# Patient Record
Sex: Female | Born: 1945 | Race: White | Hispanic: No | Marital: Married | Smoking: Never smoker
Health system: Southern US, Community
[De-identification: ages and names within clinical notes are randomized; demographics above are authoritative.]

## PROBLEM LIST (undated history)

## (undated) DIAGNOSIS — I340 Nonrheumatic mitral (valve) insufficiency: Secondary | ICD-10-CM

## (undated) DIAGNOSIS — E039 Hypothyroidism, unspecified: Secondary | ICD-10-CM

## (undated) DIAGNOSIS — Z780 Asymptomatic menopausal state: Secondary | ICD-10-CM

## (undated) DIAGNOSIS — Q210 Ventricular septal defect: Secondary | ICD-10-CM

## (undated) DIAGNOSIS — L309 Dermatitis, unspecified: Secondary | ICD-10-CM

## (undated) DIAGNOSIS — E785 Hyperlipidemia, unspecified: Secondary | ICD-10-CM

## (undated) DIAGNOSIS — I5189 Other ill-defined heart diseases: Secondary | ICD-10-CM

## (undated) HISTORY — DX: Nonrheumatic mitral (valve) insufficiency: I34.0

## (undated) HISTORY — DX: Hypothyroidism, unspecified: E03.9

## (undated) HISTORY — DX: Hyperlipidemia, unspecified: E78.5

## (undated) HISTORY — DX: Ventricular septal defect: Q21.0

## (undated) HISTORY — PX: COMBINED HYSTEROSCOPY DIAGNOSTIC / D&C: SUR297

## (undated) HISTORY — DX: Dermatitis, unspecified: L30.9

## (undated) HISTORY — DX: Asymptomatic menopausal state: Z78.0

## (undated) HISTORY — DX: Other ill-defined heart diseases: I51.89

---

## 1998-12-06 ENCOUNTER — Encounter: Payer: Self-pay | Admitting: Family Medicine

## 1998-12-06 ENCOUNTER — Encounter: Admission: RE | Admit: 1998-12-06 | Discharge: 1998-12-06 | Payer: Self-pay | Admitting: Family Medicine

## 1999-10-24 ENCOUNTER — Other Ambulatory Visit: Admission: RE | Admit: 1999-10-24 | Discharge: 1999-10-24 | Payer: Self-pay | Admitting: *Deleted

## 1999-12-07 ENCOUNTER — Encounter: Payer: Self-pay | Admitting: Family Medicine

## 1999-12-07 ENCOUNTER — Encounter: Admission: RE | Admit: 1999-12-07 | Discharge: 1999-12-07 | Payer: Self-pay | Admitting: Family Medicine

## 2003-10-07 ENCOUNTER — Other Ambulatory Visit: Admission: RE | Admit: 2003-10-07 | Discharge: 2003-10-07 | Payer: Self-pay | Admitting: *Deleted

## 2003-10-13 ENCOUNTER — Encounter: Admission: RE | Admit: 2003-10-13 | Discharge: 2003-10-13 | Payer: Self-pay | Admitting: Family Medicine

## 2003-10-19 ENCOUNTER — Encounter: Admission: RE | Admit: 2003-10-19 | Discharge: 2003-10-19 | Payer: Self-pay | Admitting: Family Medicine

## 2003-12-28 ENCOUNTER — Encounter (INDEPENDENT_AMBULATORY_CARE_PROVIDER_SITE_OTHER): Payer: Self-pay | Admitting: Specialist

## 2003-12-28 ENCOUNTER — Ambulatory Visit (HOSPITAL_COMMUNITY): Admission: RE | Admit: 2003-12-28 | Discharge: 2003-12-28 | Payer: Self-pay | Admitting: Gynecology

## 2003-12-28 ENCOUNTER — Ambulatory Visit (HOSPITAL_BASED_OUTPATIENT_CLINIC_OR_DEPARTMENT_OTHER): Admission: RE | Admit: 2003-12-28 | Discharge: 2003-12-28 | Payer: Self-pay | Admitting: Gynecology

## 2007-07-17 ENCOUNTER — Other Ambulatory Visit: Admission: RE | Admit: 2007-07-17 | Discharge: 2007-07-17 | Payer: Self-pay | Admitting: Family Medicine

## 2008-09-03 ENCOUNTER — Emergency Department (HOSPITAL_COMMUNITY): Admission: EM | Admit: 2008-09-03 | Discharge: 2008-09-03 | Payer: Self-pay | Admitting: Emergency Medicine

## 2010-02-13 ENCOUNTER — Encounter: Payer: Self-pay | Admitting: Family Medicine

## 2010-06-09 NOTE — Op Note (Signed)
Sandra Monroe, Sandra Monroe               ACCOUNT NO.:  [id number]   MEDICAL RECORD NO.:  [id number]          PATIENT TYPE:  AMB   LOCATION:  [facility]                         FACILITY:  [facility]   PHYSICIAN:  Ivor Costa. Farrel Gobble, M.D. DATE OF BIRTH:  1945/09/06   DATE OF PROCEDURE:  12/28/2003  DATE OF DISCHARGE:                                 OPERATIVE REPORT   PREOPERATIVE DIAGNOSES:  1.  Postmenopausal bleeding.  2.  Endometrial defect.   POSTOPERATIVE DIAGNOSES:  1.  Postmenopausal bleeding.  2.  Endometrial defect.   PROCEDURE:  Hysteroscopic myomectomy, dilatation and curettage,  hysteroscopy.   SURGEON:  Ivor Costa. Farrel Gobble, M.D.   ANESTHESIA:  General plus paracervical block with 0.5% Marcaine solution.   ESTIMATED BLOOD LOSS:  Minimal.   I&O DEFICIT:  3%.  Sorbitol was 185, minus a large amount on the floor and  towels.   FINDINGS:  A large posterior wall defect, probable fibroid with fluffy  endometrium.   PATHOLOGY:  Endometrial curettings, endometrial resection, and cervical  curettings.   COMPLICATIONS:  None.   PROCEDURE:  Patient was taken to the operating room.  General anesthesia was  induced.  She was placed in a dorsal lithotomy position.  Laminaria placed  the night before was removed, and then prepped and draped in the usual  sterile fashion.  A bivalve speculum was placed in the vagina, and the  cervix was visualized and stabilized with a single-tooth tenaculum.  Then 20  cc of dilute Pitressin solution was then injected circumferentially around  the cervix, after which uterine sound was placed for orientation of the  canal.  The cervix was dilated up to 31 [place], after which the operative  hysteroscope was advanced through the cervix.  Initially what appeared to be  very fluffy endometrium somewhat obscuring our view was seen; therefore, the  hysteroscope was removed and sharp curettage was performed.  This was sent  as a separate specimen.  The hysteroscope was  then advanced back through the  cervix, and at this point, a large posterior wall defect essentially  obliterating the cavity was appreciated, and this was probably the defect we  had seen on the sonohystogram.  The hysteroscope single loop was able to be  placed underneath this defect, and it was removed with several passes across  the posterior and posterolateral wall towards the right.  Many times, we  needed to remove portions of the sharp curetting for visualization.  Once  the defect was felt to be removed in its entirety, the base was cauterized,  and she did have Pitressin, and there was no bleeding from the operative  site.  A sharp curetting of the endocervix was also performed in order to  remove the endocervical polyp that was previously appreciated.  The  hysteroscope was readvanced.  The remainder of the cavity was inspected and  noted to be unremarkable.  The cervix similarly was inspected and noted to  be unremarkable.  The  instruments were then removed prior to which the patient received 10 cc of  0.5% Marcaine solution circumferentially for postoperative pain.  She was  extubated in the OR and transferred to the PACU in stable condition.     Trac   THL/MEDQ  D:  12/28/2003  T:  12/28/2003  Job:  [id number]

## 2010-06-09 NOTE — H&P (Signed)
Sandra Monroe, Sandra Monroe               ACCOUNT NO.:  [id number]   MEDICAL RECORD NO.:  [id number]          PATIENT TYPE:  AMB   LOCATION:  [facility]                         FACILITY:  [facility]   PHYSICIAN:  Ivor Costa. Farrel Gobble, M.D. DATE OF BIRTH:  24-Oct-1945   DATE OF ADMISSION:  DATE OF DISCHARGE:                                HISTORY & PHYSICAL   CHIEF COMPLAINT:  The patient is a 65 year old postmenopausal woman who is  about 10 years PMP who is not on hormone replacement.  Patient had a  spontaneous four day cycle in September of 2005 with cramping and seemed  typical for usual cycle although lighter flow.  She has essentially a  negative GYN history.  She had an ultrasound done at [facility] on October 13, 2003 which showed her uterus to be slightly enlarged for postmenopausal  status at 8.3 X 4.4 X 6.6.  There were two small fibroids and her lining was  markedly thickened at 15 mm.  The patient at this point had been seen by her  primary care physician and was referred to our office.  Because of the  abnormality we had the patient return back to the office to further  evaluation the thickened endometrium.  We found her lining was 13.2 and  solid and sonohysterogram was performed.  With distention of the wall she  was found to have a posterior wall defect that measured 22 X 22 by 11 mm  otherwise was remarkable only for an endocervical polyp.   OBSTETRICAL HISTORY:  Her past OB-GYN history is significant for two  spontaneous vaginal deliveries followed by one Cesarean section.  She has  had normal Pap smear's as mentioned above.   PAST MEDICAL HISTORY:  Significant for hypercholesterolemia.   PAST SURGICAL HISTORY:  Cesarean section in 1980.   ALLERGIES:  No known drug allergies.   MEDICATIONS:  She is on Lescol.   SOCIAL HISTORY:  She is married.  She uses no tobacco, social alcohol and  regular exercise.   FAMILY HISTORY:  Negative for gynecologic cancers.   PHYSICAL EXAMINATION:   GENERAL:  She is a well-appearing female in no acute  distress.  HEART:  Regular rate.  LUNGS:  Clear to auscultation.  ABDOMEN:  Soft without masses or tenderness.  GYN:  She has postmenopausal changes to the external genitalia.  BUS is  negative.  Vagina is pale and smooth.  Cervix is most notable for a small  nonbleeding polyp at the os.  Uterus is anteverted, mobile and nontender.  Adnexa are without tenderness or fullness.  Rectovaginal examination is  confirmatory.   ASSESSMENT:  Episode of postmenopausal bleeding with a posterior wall defect  suspicious for an endometrial polyp.   PLAN:  The patient will have a Laminaria placed followed by D and C,  hysteroscopy scheduled for Tuesday, December 28, 2003.  All questions were  addressed.  Lortab was given for post-operative pain.     Trac   THL/MEDQ  D:  12/27/2003  T:  12/27/2003  Job:  [id number]

## 2011-01-08 ENCOUNTER — Other Ambulatory Visit: Payer: Self-pay | Admitting: Family Medicine

## 2011-01-08 ENCOUNTER — Other Ambulatory Visit (HOSPITAL_COMMUNITY)
Admission: RE | Admit: 2011-01-08 | Discharge: 2011-01-08 | Disposition: A | Discharge status: Home / Self Care | Payer: BC Managed Care – PPO | Source: Ambulatory Visit | Attending: Family Medicine | Admitting: Family Medicine

## 2011-01-08 DIAGNOSIS — Z1159 Encounter for screening for other viral diseases: Secondary | ICD-10-CM | POA: Insufficient documentation

## 2011-01-08 DIAGNOSIS — Z124 Encounter for screening for malignant neoplasm of cervix: Secondary | ICD-10-CM | POA: Insufficient documentation

## 2011-01-18 ENCOUNTER — Other Ambulatory Visit: Payer: Self-pay | Admitting: Family Medicine

## 2011-01-18 DIAGNOSIS — Z1231 Encounter for screening mammogram for malignant neoplasm of breast: Secondary | ICD-10-CM

## 2011-01-18 DIAGNOSIS — M899 Disorder of bone, unspecified: Secondary | ICD-10-CM

## 2011-02-08 ENCOUNTER — Other Ambulatory Visit: Payer: BC Managed Care – PPO

## 2011-02-08 ENCOUNTER — Ambulatory Visit: Payer: BC Managed Care – PPO

## 2012-01-29 ENCOUNTER — Other Ambulatory Visit: Payer: Self-pay | Admitting: Dermatology

## 2012-11-13 ENCOUNTER — Ambulatory Visit: Payer: BC Managed Care – PPO | Admitting: Cardiology

## 2012-11-22 ENCOUNTER — Encounter: Payer: Self-pay | Admitting: Cardiology

## 2012-11-22 DIAGNOSIS — E785 Hyperlipidemia, unspecified: Secondary | ICD-10-CM | POA: Insufficient documentation

## 2012-11-22 DIAGNOSIS — I5189 Other ill-defined heart diseases: Secondary | ICD-10-CM

## 2012-11-22 DIAGNOSIS — Q210 Ventricular septal defect: Secondary | ICD-10-CM

## 2012-11-22 DIAGNOSIS — E039 Hypothyroidism, unspecified: Secondary | ICD-10-CM

## 2012-11-24 ENCOUNTER — Ambulatory Visit (INDEPENDENT_AMBULATORY_CARE_PROVIDER_SITE_OTHER): Payer: BC Managed Care – PPO | Admitting: Cardiology

## 2012-11-24 ENCOUNTER — Encounter: Payer: Self-pay | Admitting: Cardiology

## 2012-11-24 VITALS — BP 112/78 | HR 69 | Ht 67.0 in | Wt 146.0 lb

## 2012-11-24 DIAGNOSIS — I5189 Other ill-defined heart diseases: Secondary | ICD-10-CM

## 2012-11-24 DIAGNOSIS — I340 Nonrheumatic mitral (valve) insufficiency: Secondary | ICD-10-CM

## 2012-11-24 DIAGNOSIS — Q210 Ventricular septal defect: Secondary | ICD-10-CM

## 2012-11-24 DIAGNOSIS — I519 Heart disease, unspecified: Secondary | ICD-10-CM

## 2012-11-24 DIAGNOSIS — E785 Hyperlipidemia, unspecified: Secondary | ICD-10-CM

## 2012-11-24 DIAGNOSIS — I059 Rheumatic mitral valve disease, unspecified: Secondary | ICD-10-CM

## 2012-11-24 HISTORY — DX: Nonrheumatic mitral (valve) insufficiency: I34.0

## 2012-11-24 NOTE — Patient Instructions (Signed)
Your physician wants you to follow-up in: 1 YEAR WITH DR. SKAINS  You will receive a reminder letter in the mail two months in advance. If you don't receive a letter, please call our office to schedule the follow-up appointment.  Your physician recommends that you continue on your current medications as directed. Please refer to the Current Medication list given to you today.  

## 2012-11-24 NOTE — Progress Notes (Signed)
      [address] [place], [place]  [address] Phone: [phone] Fax:  [phone]  Date:  11/24/2012   ID:  Sandra Monroe, DOB 05/08/1945, MRN [id number]  PCP:  No primary provider on file.   History of Present Illness: Sandra Monroe is a 67 y.o. female   with nuclear stress test (2010) secondary to exertional chest discomfort relieved with rest with a family history of CAD, hyperlipidemia, Small perimembranous VSD, mild mitral regurgitation.   Her stress test was overall low risk with normal ejection fraction and no evidence of ischemia and no change from October of 2002 study.   Her echocardiogram also showed a small perimembranous VSD with mild mitral valve prolapse and mild mitral regurgitation.   Low-dose metoprolol has been doing very well in controlling her palpitations. She is tolerating this well. Blood pressure is stable. She continues her Lescol for hyperlipidemia..    Overall, she is doing very well without any complaints, no chest pain, no fevers, no syncope    Wt Readings from Last 3 Encounters:  11/24/12 146 lb (66.225 kg)     Past Medical History  Diagnosis Date  . Menopause   . Dermatitis   . Hyperlipidemia   . Hypothyroidism   . Ventricular septal defect (VSD), perimembranous     , Small  . Diastolic dysfunction     Past Surgical History  Procedure Laterality Date  . Cesarean section    . Combined hysteroscopy diagnostic / d&c      Current Outpatient Prescriptions  Medication Sig Dispense Refill  . aspirin 81 MG tablet Take 81 mg by mouth daily.      . fluvastatin XL (LESCOL XL) 80 MG 24 hr tablet Take 80 mg by mouth daily.      . metoprolol succinate (TOPROL-XL) 25 MG 24 hr tablet Take 25 mg by mouth daily.      . Multiple Vitamin (MULTIVITAMIN) tablet Take 1 tablet by mouth daily.       No current facility-administered medications for this visit.    Allergies:   No Known Allergies  Social History:  The patient  reports  that she has never smoked. She does not have any smokeless tobacco history on file. She reports that she drinks about 1.2 ounces of alcohol per week. She reports that she does not use illicit drugs.   ROS:  Please see the history of present illness.   Chest pain, no orthopnea, no PND, no strokelike symptoms, no shortness of breath, no pulsations  PHYSICAL EXAM: VS:  BP 112/78  Pulse 69  Ht 5\' 7"  (1.702 m)  Wt 146 lb (66.225 kg)  BMI 22.86 kg/m2 Well nourished, well developed, in no acute distress HEENT: normal Neck: no JVD Cardiac:  normal S1, S2; RRR; no murmur Lungs:  clear to auscultation bilaterally, no wheezing, rhonchi or rales Abd: soft, nontender, no hepatomegaly Ext: no edema Skin: warm and dry Neuro: no focal abnormalities noted  EKG:  Sinus rhythm rate 69 with no other abnormalities. No change from prior.  ASSESSMENT AND PLAN:  1. Palpitations-well controlled with metoprolol. No symptoms. 2. VSD-very small, perimembranous. Should be of no clinical consequence. No need to repeat echocardiogram. No change in symptoms. 3. Hyperlipidemia-continue with Lescol. Monitored by Dr. Ihor Dow. No changes made. 4. Mild mitral regurgitation-doing well. 5. Diastolic dysfunction-mild. Currently no symptoms.   Signed, Donato Schultz, MD [facility]  11/24/2012 11:04 AM

## 2012-11-30 ENCOUNTER — Other Ambulatory Visit: Payer: Self-pay | Admitting: Cardiology

## 2013-12-04 ENCOUNTER — Ambulatory Visit: Payer: BC Managed Care – PPO | Admitting: Cardiology

## 2014-01-01 ENCOUNTER — Ambulatory Visit (INDEPENDENT_AMBULATORY_CARE_PROVIDER_SITE_OTHER): Payer: Medicare Other | Admitting: Cardiology

## 2014-01-01 ENCOUNTER — Encounter: Payer: Self-pay | Admitting: Cardiology

## 2014-01-01 VITALS — BP 98/72 | HR 74 | Ht 67.0 in | Wt 134.0 lb

## 2014-01-01 DIAGNOSIS — I519 Heart disease, unspecified: Secondary | ICD-10-CM

## 2014-01-01 DIAGNOSIS — R519 Headache, unspecified: Secondary | ICD-10-CM

## 2014-01-01 DIAGNOSIS — I5189 Other ill-defined heart diseases: Secondary | ICD-10-CM

## 2014-01-01 DIAGNOSIS — Q210 Ventricular septal defect: Secondary | ICD-10-CM

## 2014-01-01 DIAGNOSIS — I340 Nonrheumatic mitral (valve) insufficiency: Secondary | ICD-10-CM

## 2014-01-01 DIAGNOSIS — R51 Headache: Secondary | ICD-10-CM

## 2014-01-01 MED ORDER — METOPROLOL SUCCINATE ER 25 MG PO TB24: 25.0000 mg | ORAL_TABLET | Freq: Every day | ORAL | Status: DC

## 2014-01-01 NOTE — Progress Notes (Signed)
[address] [place], [place]  [address] Phone: [phone]) [phone] Fax:  [phone]  Date:  01/01/2014   ID:  Sandra Monroe, DOB 1945-05-29, MRN [id number]  PCP:  Sandra Monroe, ADAKU, MD   History of Present Illness: Sandra Monroe is a 68 y.o. female   with nuclear stress test (2010) secondary to exertional chest discomfort relieved with rest with a family history of CAD, hyperlipidemia, Small perimembranous VSD, mild mitral regurgitation.   Her stress test was overall low risk with normal ejection fraction and no evidence of ischemia and no change from October of 2002 study.   Her echocardiogram also showed a small perimembranous VSD with mild mitral valve prolapse and mild mitral regurgitation.   Low-dose metoprolol has been doing very well in controlling her palpitations however that he occasionally still occur. She is tolerating this well. Blood pressure is stable. She continues her Lescol for hyperlipidemia..    Overall, she is doing very well without any complaints, no chest pain, no fevers, no syncope. She currently has a head cold.  She sustained a fall on her staircase. Over the past 3 months since the fall, she has had headaches. She's never had headaches before. I asked her to discuss this with Sandra Monroe.    Wt Readings from Last 3 Encounters:  01/01/14 134 lb (60.782 kg)  11/24/12 146 lb (66.225 kg)     Past Medical History  Diagnosis Date  . Menopause   . Dermatitis   . Hyperlipidemia   . Hypothyroidism   . Ventricular septal defect (VSD), perimembranous     , Small  . Diastolic dysfunction   . Mitral regurgitation 11/24/2012    Mild     Past Surgical History  Procedure Laterality Date  . Cesarean section    . Combined hysteroscopy diagnostic / d&c      Current Outpatient Prescriptions  Medication Sig Dispense Refill  . aspirin 81 MG tablet Take 81 mg by mouth daily.    . fluvastatin XL (LESCOL XL) 80 MG 24 hr tablet Take 80 mg by mouth  daily.    . metoprolol succinate (TOPROL-XL) 25 MG 24 hr tablet TAKE 1 TABLET DAILY 90 tablet 3  . Multiple Vitamin (MULTIVITAMIN) tablet Take 1 tablet by mouth daily.     No current facility-administered medications for this visit.    Allergies:   No Known Allergies  Social History:  The patient  reports that she has never smoked. She does not have any smokeless tobacco history on file. She reports that she drinks about 1.2 oz of alcohol per week. She reports that she does not use illicit drugs.   ROS:  Please see the history of present illness.   Chest pain, no orthopnea, no PND, no strokelike symptoms, no shortness of breath, no pulsations  PHYSICAL EXAM: VS:  BP 98/72 mmHg  Pulse 74  Ht 5\' 7"  (1.702 m)  Wt 134 lb (60.782 kg)  BMI 20.98 kg/m2 Well nourished, well developed, in no acute distress HEENT: normal Neck: no JVD Cardiac:  normal S1, S2; RRR; no (barely audible) murmur Lungs:  clear to auscultation bilaterally, no wheezing, rhonchi or rales Abd: soft, nontender, no hepatomegaly Ext: no edema Skin: warm and dry Neuro: no focal abnormalities noted  EKG: 01/01/14-74, sinus rhythm, nonspecific ST changes-previously Sinus rhythm rate 69 with no other abnormalities. No change from prior.  ASSESSMENT AND PLAN:  1. Palpitations-well controlled with metoprolol. No symptoms. 2. VSD-very small, perimembranous. Should  be of no clinical consequence. No need to repeat echocardiogram. No change in symptoms. 3. Hyperlipidemia-continue with Lescol. Monitored by Dr. Ihor Monroe. No changes made. 4. Mild mitral regurgitation-doing well. 5. Headaches-please discuss with Sandra Monroe. Could be secondary to musculoskeletal injury following fall down staircase but new onset headaches should be further investigated. 6. Diastolic dysfunction-mild. Currently no symptoms.  7. One-year follow-up  Signed, Sandra SchultzMark Skains, MD [facility]  01/01/2014 11:12 AM

## 2014-01-01 NOTE — Patient Instructions (Signed)
The current medical regimen is effective;  continue present plan and medications.  Follow up in 1 year with Dr. Skains.  You will receive a letter in the mail 2 months before you are due.  Please call us when you receive this letter to schedule your follow up appointment.  Thank you for choosing [facility]!!     

## 2014-02-24 ENCOUNTER — Other Ambulatory Visit: Payer: Self-pay | Admitting: Family Medicine

## 2014-02-24 DIAGNOSIS — E039 Hypothyroidism, unspecified: Secondary | ICD-10-CM

## 2014-02-24 DIAGNOSIS — E038 Other specified hypothyroidism: Secondary | ICD-10-CM

## 2014-02-26 ENCOUNTER — Ambulatory Visit
Admission: RE | Admit: 2014-02-26 | Discharge: 2014-02-26 | Disposition: A | Discharge status: Home / Self Care | Payer: Medicare Other | Source: Ambulatory Visit | Attending: Family Medicine | Admitting: Family Medicine

## 2014-02-26 DIAGNOSIS — E038 Other specified hypothyroidism: Secondary | ICD-10-CM

## 2014-02-26 DIAGNOSIS — E039 Hypothyroidism, unspecified: Secondary | ICD-10-CM

## 2014-06-07 ENCOUNTER — Telehealth: Payer: Self-pay | Admitting: Cardiology

## 2014-06-07 NOTE — Telephone Encounter (Signed)
Calling stating has an appointment Wed 5/18 with Dr. Anne FuSkains.  States she just wanted to let us know she has had SOB off and on for about a year.  States she has to take 3 deep breaths in order to get a breath.  Denies CP, dizziness, no other symptoms.  Advised Wed is the earliest that Dr. Anne FuSkains has an available appointment.  She verbalizes understanding and doesn't feel that this is urgent since she has had symptoms for at least 6 mo or more. Will see him Wed 5/18.

## 2014-06-07 NOTE — Telephone Encounter (Signed)
New Message  Pt made appt w/ next avail Skains (for 5/18 @ 9:15) due to SOB and CP. Pt wanted to make sure this appt is okay or, if need be, made sooner. Please call back and discuss.   Pt c/o Shortness Of Breath: STAT if SOB developed within the last 24 hours or pt is noticeably SOB on the phone  1. Are you currently SOB (can you hear that pt is SOB on the phone)? no  2. How long have you been experiencing SOB? Long time- off/on for about 1 year  3. Are you SOB when sitting or when up moving around? Doesn't matter; all times of day  4. Are you currently experiencing any other symptoms? Chest pain (sometimes)- goes away after 3 deep breathes  Pt c/o of Chest Pain: STAT if CP now or developed within 24 hours  1. Are you having CP right now? no  2. Are you experiencing any other symptoms (ex. SOB, nausea, vomiting, sweating)? no  3. How long have you been experiencing CP? Same as SOB- about 1 year off//on  4. Is your CP continuous or coming and going? Comes and goes  5. Have you taken Nitroglycerin? never ?

## 2014-06-09 ENCOUNTER — Encounter: Payer: Self-pay | Admitting: Cardiology

## 2014-06-09 ENCOUNTER — Ambulatory Visit (INDEPENDENT_AMBULATORY_CARE_PROVIDER_SITE_OTHER): Payer: Medicare Other | Admitting: Cardiology

## 2014-06-09 VITALS — BP 100/60 | HR 76 | Ht 67.0 in | Wt 133.0 lb

## 2014-06-09 DIAGNOSIS — Q210 Ventricular septal defect: Secondary | ICD-10-CM | POA: Diagnosis not present

## 2014-06-09 DIAGNOSIS — R002 Palpitations: Secondary | ICD-10-CM

## 2014-06-09 DIAGNOSIS — E785 Hyperlipidemia, unspecified: Secondary | ICD-10-CM | POA: Diagnosis not present

## 2014-06-09 DIAGNOSIS — I340 Nonrheumatic mitral (valve) insufficiency: Secondary | ICD-10-CM | POA: Diagnosis not present

## 2014-06-09 DIAGNOSIS — R079 Chest pain, unspecified: Secondary | ICD-10-CM

## 2014-06-09 NOTE — Patient Instructions (Signed)
Medication Instructions:  Your physician recommends that you continue on your current medications as directed. Please refer to the Current Medication list given to you today.  Testing/Procedures: Your physician has requested that you have an echocardiogram. Echocardiography is a painless test that uses sound waves to create images of your heart. It provides your doctor with information about the size and shape of your heart and how well your heart's chambers and valves are working. This procedure takes approximately one hour. There are no restrictions for this procedure.  Your physician has requested that you have a myoview. For further information please visit [email or web address]. Please follow instruction sheet, as given.  Follow-Up: Follow up in 1 year with Dr. Anne FuSkains.  You will receive a letter in the mail 2 months before you are due.  Please call us when you receive this letter to schedule your follow up appointment.  Thank you for choosing [facility]!!

## 2014-06-09 NOTE — Progress Notes (Signed)
[address] [place], [place]  [address] Phone: [phone](336) [phone] Fax:  [phone]) [phone]  Date:  06/09/2014   ID:  Sandra NoelJane Spurrier, DOB 06/13/1945, MRN [id number]  PCP:  Gretel AcreNNODI, ADAKU, MD   History of Present Illness: Sandra Monroe is a 69 y.o. female   with nuclear stress test (2010) secondary to exertional chest discomfort relieved with rest with a family history of CAD, hyperlipidemia, Small perimembranous VSD, mild mitral regurgitation.   Her stress test was overall low risk with normal ejection fraction and no evidence of ischemia and no change from October of 2002 study.   Her echocardiogram also showed a small perimembranous VSD with mild mitral valve prolapse and mild mitral regurgitation.   Low-dose metoprolol has been doing very well in controlling her palpitations however that he occasionally still occur. She is tolerating this well. Blood pressure is stable. She continues her Lescol for hyperlipidemia..    She has been experiencing some increased shortness of breath/chest tightness, waking up at night sometimes short of breath, can't catch breath takes approximately 3 breast to get a full breath. She also feels some chest pressure. ?allergies, walking. If nose is stuffy makes a difference. Chest tightness. Worried about 911?  Overall, she is doing very well without any complaints, no chest pain, no fevers, no syncope. She currently has a head cold.  She sustained a fall on her staircase. Over the past 3 months since the fall, she has had headaches. She's never had headaches before. I asked her to discuss this with Dr.Nnodi.    Wt Readings from Last 3 Encounters:  06/09/14 133 lb (60.328 kg)  01/01/14 134 lb (60.782 kg)  11/24/12 146 lb (66.225 kg)     Past Medical History  Diagnosis Date  . Menopause   . Dermatitis   . Hyperlipidemia   . Hypothyroidism   . Ventricular septal defect (VSD), perimembranous     , Small  . Diastolic dysfunction   . Mitral  regurgitation 11/24/2012    Mild     Past Surgical History  Procedure Laterality Date  . Cesarean section    . Combined hysteroscopy diagnostic / d&c      Current Outpatient Prescriptions  Medication Sig Dispense Refill  . aspirin 81 MG tablet Take 81 mg by mouth daily.    . fluvastatin XL (LESCOL XL) 80 MG 24 hr tablet Take 80 mg by mouth daily.    . metoprolol succinate (TOPROL-XL) 25 MG 24 hr tablet Take 1 tablet (25 mg total) by mouth daily. 90 tablet 3  . Multiple Vitamin (MULTIVITAMIN) tablet Take 1 tablet by mouth daily.     No current facility-administered medications for this visit.    Allergies:   No Known Allergies FAMILY hx - Father pacer. Died in 8680's.  Social History:  The patient  reports that she has never smoked. She does not have any smokeless tobacco history on file. She reports that she drinks about 1.2 oz of alcohol per week. She reports that she does not use illicit drugs.   ROS:  Please see the history of present illness.   Chest pain, no orthopnea, no PND, no strokelike symptoms, no shortness of breath, no pulsations  PHYSICAL EXAM: VS:  BP 100/60 mmHg  Pulse 76  Ht 5\' 7"  (1.702 m)  Wt 133 lb (60.328 kg)  BMI 20.83 kg/m2 Thin, in no acute distress HEENT: normal Neck: no JVD Cardiac:  normal S1, S2; RRR; no (barely  audible) murmur Lungs:  clear to auscultation bilaterally, no wheezing, rhonchi or rales Abd: soft, nontender, no hepatomegaly Ext: no edema Skin: warm and dry Neuro: no focal abnormalities noted  EKG: 01/01/14-74, sinus rhythm, nonspecific ST changes-previously Sinus rhythm rate 69 with no other abnormalities. No change from prior.  ASSESSMENT AND PLAN:  1. Chest tightness/shortness of breath-has sensation that she needs to taken 3 breaths in order to feel satisfactory. Has some chest tightness associated with this. Can occur randomly throughout the day. Has occurred at night with palpitations. We will check an echocardiogram to ensure  proper structure and function, prior small perimembranous VSD noted. We will also check a nuclear stress test to ensure that there is no evidence of ischemia as this may be a manifestation. If reassuring, differential includes stress or anxiety, musculoskeletal. 2. Palpitations-well controlled with metoprolol. No symptoms. Rare, wake at night heart racing.  3. VSD-very small, perimembranous. Should be of no clinical consequence. 4. Hyperlipidemia-continue with Lescol. Monitored by Dr. Ihor DowNnodi. No changes made. 5. Mild mitral regurgitation-checking echo. 6. Flow weight-encouraged to maintain her current weight. Increased risk if BMI becomes less than 20. 7. Diastolic dysfunction-mild. Currently no symptoms.  8. One-year follow-up  Signed, Donato SchultzMark Skains, MD [facility]  06/09/2014 9:27 AM

## 2014-06-24 ENCOUNTER — Encounter (HOSPITAL_COMMUNITY): Payer: Medicare Other

## 2014-06-24 ENCOUNTER — Other Ambulatory Visit (HOSPITAL_COMMUNITY): Payer: Medicare Other

## 2015-01-20 ENCOUNTER — Other Ambulatory Visit: Payer: Self-pay

## 2015-01-20 MED ORDER — METOPROLOL SUCCINATE ER 25 MG PO TB24: 25.0000 mg | ORAL_TABLET | Freq: Every day | ORAL | Status: DC

## 2015-01-20 NOTE — Telephone Encounter (Signed)
Needs 30 day supply of Metoprolol sent to [facility] on [address] and then 90 day supply sent to [facility].  Jake BatheMark C Skains, MD at 06/09/2014 9:16 AM  metoprolol succinate (TOPROL-XL) 25 MG 24 hr tabletTake 1 tablet (25 mg total) by mouth daily ASSESSMENT AND PLAN: 2.  Palpitations-well controlled with metoprolol. No symptoms. Rare, wake at night heart racing.

## 2015-02-24 ENCOUNTER — Other Ambulatory Visit: Payer: Self-pay | Admitting: Family Medicine

## 2015-02-24 DIAGNOSIS — L942 Calcinosis cutis: Secondary | ICD-10-CM

## 2015-04-04 ENCOUNTER — Ambulatory Visit
Admission: RE | Admit: 2015-04-04 | Discharge: 2015-04-04 | Disposition: A | Discharge status: Home / Self Care | Payer: Medicare Other | Source: Ambulatory Visit | Attending: Family Medicine | Admitting: Family Medicine

## 2015-04-04 DIAGNOSIS — L942 Calcinosis cutis: Secondary | ICD-10-CM

## 2015-05-27 ENCOUNTER — Other Ambulatory Visit: Payer: Self-pay | Admitting: *Deleted

## 2015-05-27 ENCOUNTER — Telehealth: Payer: Self-pay | Admitting: Cardiology

## 2015-05-27 MED ORDER — METOPROLOL SUCCINATE ER 25 MG PO TB24: 25.0000 mg | ORAL_TABLET | Freq: Every day | ORAL | Status: DC

## 2015-05-27 NOTE — Telephone Encounter (Signed)
°*  STAT* If patient is at the pharmacy, call can be transferred to refill team.   1. Which medications need to be refilled? (please list name of each medication and dose if known) Metoprolol   2. Which pharmacy/location (including street and city if local pharmacy) is medication to be sent to?[facility]   3. Do they need a 30 day or 90 day supply? 90

## 2015-07-11 NOTE — Progress Notes (Signed)
[address] [place], [place]  [address] Phone: [phone] Fax:  [phone]  Date:  07/11/2015   ID:  [name], DOB 08-27-1945, MRN [id number]  PCP:  Gretel Acre, MD   History of Present Illness: Sandra Monroe is a 70 y.o. female   with nuclear stress test (2010) secondary to exertional chest discomfort relieved with rest with a family history of CAD, hyperlipidemia, Small perimembranous VSD, mild mitral regurgitation.   Her stress test was overall low risk with normal ejection fraction and no evidence of ischemia and no change from October of 2002 study.   Her echocardiogram also showed a small perimembranous VSD with mild mitral valve prolapse and mild mitral regurgitation.   Low-dose metoprolol has been doing very well in controlling her palpitations however that he occasionally still occur. She is tolerating this well. Blood pressure is stable. She continues her Lescol for hyperlipidemia..    She has been experiencing some increased shortness of breath/chest tightness, waking up at night sometimes short of breath, can't catch breath takes approximately 3 breast to get a full breath. She also feels some chest pressure. ?allergies, walking. If nose is stuffy makes a difference. Chest tightness. Worried about 911?  Overall, she is doing very well without any complaints, no chest pain, no fevers, no syncope. She currently has a head cold.  She sustained a fall on her staircase. Over the past 3 months since the fall, she has had headaches. She's never had headaches before. I asked her to discuss this with Dr.Nnodi.    Wt Readings from Last 3 Encounters:  06/09/14 133 lb (60.328 kg)  01/01/14 134 lb (60.782 kg)  11/24/12 146 lb (66.225 kg)     Past Medical History  Diagnosis Date  . Menopause   . Dermatitis   . Hyperlipidemia   . Hypothyroidism   . Ventricular septal defect (VSD), perimembranous     , Small  . Diastolic dysfunction   . Mitral  regurgitation 11/24/2012    Mild     Past Surgical History  Procedure Laterality Date  . Cesarean section    . Combined hysteroscopy diagnostic / d&c      Current Outpatient Prescriptions  Medication Sig Dispense Refill  . aspirin 81 MG tablet Take 81 mg by mouth daily.    . fluvastatin XL (LESCOL XL) 80 MG 24 hr tablet Take 80 mg by mouth daily.    . metoprolol succinate (TOPROL-XL) 25 MG 24 hr tablet Take 1 tablet (25 mg total) by mouth daily. 90 tablet 0  . Multiple Vitamin (MULTIVITAMIN) tablet Take 1 tablet by mouth daily.     No current facility-administered medications for this visit.    Allergies:   No Known Allergies FAMILY hx - Father pacer. Died in 62's.  Social History:  The patient  reports that she has never smoked. She does not have any smokeless tobacco history on file. She reports that she drinks about 1.2 oz of alcohol per week. She reports that she does not use illicit drugs.   ROS:  Please see the history of present illness.   Chest pain, no orthopnea, no PND, no strokelike symptoms, no shortness of breath, no pulsations  PHYSICAL EXAM: VS:  There were no vitals taken for this visit. Thin, in no acute distress HEENT: normal Neck: no JVD Cardiac:  normal S1, S2; RRR; no (barely audible) murmur Lungs:  clear to auscultation bilaterally, no wheezing, rhonchi or rales Abd:  soft, nontender, no hepatomegaly Ext: no edema Skin: warm and dry Neuro: no focal abnormalities noted  EKG: 01/01/14-74, sinus rhythm, nonspecific ST changes-previously Sinus rhythm rate 69 with no other abnormalities. No change from prior.  ASSESSMENT AND PLAN:  1. Chest tightness/shortness of breath-has sensation that she needs to taken 3 breaths in order to feel satisfactory. Has some chest tightness associated with this. Can occur randomly throughout the day. Has occurred at night with palpitations. We will check an echocardiogram to ensure proper structure and function, prior small  perimembranous VSD noted. We will also check a nuclear stress test to ensure that there is no evidence of ischemia as this may be a manifestation. If reassuring, differential includes stress or anxiety, musculoskeletal. 2. Palpitations-well controlled with metoprolol. No symptoms. Rare, wake at night heart racing.  3. VSD-very small, perimembranous. Should be of no clinical consequence. 4. Hyperlipidemia-continue with Lescol. Monitored by Dr. Ihor DowNnodi. No changes made. 5. Mild mitral regurgitation-checking echo. 6. Flow weight-encouraged to maintain her current weight. Increased risk if BMI becomes less than 20. 7. Diastolic dysfunction-mild. Currently no symptoms.  8. One-year follow-up  Signed, Donato SchultzMark Sebastin Perlmutter, MD [facility]  07/11/2015 2:25 PM    This encounter was created in error - please disregard.

## 2015-07-18 ENCOUNTER — Encounter: Payer: Medicare Other | Admitting: Cardiology

## 2015-08-01 ENCOUNTER — Encounter: Payer: Self-pay | Admitting: Cardiology

## 2015-08-09 ENCOUNTER — Encounter: Payer: Self-pay | Admitting: *Deleted

## 2015-08-29 ENCOUNTER — Ambulatory Visit: Payer: Medicare Other | Admitting: Cardiology

## 2015-09-05 ENCOUNTER — Other Ambulatory Visit: Payer: Self-pay | Admitting: Cardiology

## 2015-10-07 ENCOUNTER — Ambulatory Visit: Payer: Medicare Other | Admitting: Cardiology

## 2015-11-24 ENCOUNTER — Other Ambulatory Visit: Payer: Self-pay | Admitting: Family Medicine

## 2015-11-24 DIAGNOSIS — Z1231 Encounter for screening mammogram for malignant neoplasm of breast: Secondary | ICD-10-CM

## 2015-12-03 ENCOUNTER — Other Ambulatory Visit: Payer: Self-pay | Admitting: Cardiology

## 2015-12-06 ENCOUNTER — Encounter: Payer: Self-pay | Admitting: Cardiology

## 2015-12-06 ENCOUNTER — Ambulatory Visit (INDEPENDENT_AMBULATORY_CARE_PROVIDER_SITE_OTHER): Payer: Medicare Other | Admitting: Cardiology

## 2015-12-06 VITALS — BP 118/74 | HR 73 | Ht 66.5 in | Wt 142.2 lb

## 2015-12-06 DIAGNOSIS — R002 Palpitations: Secondary | ICD-10-CM

## 2015-12-06 DIAGNOSIS — Q210 Ventricular septal defect: Secondary | ICD-10-CM | POA: Diagnosis not present

## 2015-12-06 DIAGNOSIS — E7800 Pure hypercholesterolemia, unspecified: Secondary | ICD-10-CM

## 2015-12-06 MED ORDER — METOPROLOL SUCCINATE ER 25 MG PO TB24: 25.0000 mg | ORAL_TABLET | Freq: Every day | ORAL | 3 refills | Status: DC

## 2015-12-06 NOTE — Progress Notes (Signed)
[address] [place], [place]  [address] Phone: [phone] Fax:  [phone]  Date:  12/06/2015   ID:  Sandra Monroe, DOB 08-May-1945, MRN [id number]  PCP:  Sandra LabellaMILLER,LISA LYNN, MD   History of Present Illness: Sandra Monroe is a 70 y.o. female here for follow-up for small perimembranous VSD, mild mitral regurgitation, palpitations.  Back in 2010 she had a nuclear stress test that was overall low risk.  Low-dose metoprolol controls palpitations. Lescol for hyperlipidemia.  Overall, she is doing very well without any complaints, no chest pain, no fevers, no syncope. Rare palpitations.  Husband died 2016. Had AFIB  Wt Readings from Last 3 Encounters:  12/06/15 142 lb 3.2 oz (64.5 kg)  06/09/14 133 lb (60.3 kg)  01/01/14 134 lb (60.8 kg)     Past Medical History:  Diagnosis Date  . Dermatitis   . Diastolic dysfunction   . Hyperlipidemia   . Hypothyroidism   . Menopause   . Mitral regurgitation 11/24/2012   Mild   . Ventricular septal defect (VSD), perimembranous    , Small    Past Surgical History:  Procedure Laterality Date  . CESAREAN SECTION    . COMBINED HYSTEROSCOPY DIAGNOSTIC / D&C      Current Outpatient Prescriptions  Medication Sig Dispense Refill  . aspirin 81 MG tablet Take 81 mg by mouth daily.    . fluvastatin XL (LESCOL XL) 80 MG 24 hr tablet Take 80 mg by mouth daily.    . metoprolol succinate (TOPROL-XL) 25 MG 24 hr tablet Take 1 tablet (25 mg total) by mouth daily. 90 tablet 3  . Multiple Vitamin (MULTIVITAMIN) tablet Take 1 tablet by mouth daily.     No current facility-administered medications for this visit.     Allergies:   No Known Allergies FAMILY hx - Father pacer. Died in 3480's.  Social History:  The patient  reports that she has never smoked. She does not have any smokeless tobacco history on file. She reports that she drinks about 1.2 oz of alcohol per week . She reports that she does not use drugs.   ROS:  Please  see the history of present illness.   Chest pain, no orthopnea, no PND, no strokelike symptoms, no shortness of breath, no pulsations  PHYSICAL EXAM: VS:  BP 118/74   Pulse 73   Ht 5' 6.5" (1.689 m)   Wt 142 lb 3.2 oz (64.5 kg)   LMP  (LMP Unknown)   BMI 22.61 kg/m  Thin, in no acute distress HEENT: normal Neck: no JVD Cardiac:  normal S1, S2; RRR; no (barely audible) murmur Lungs:  clear to auscultation bilaterally, no wheezing, rhonchi or rales Abd: soft, nontender, no hepatomegaly Ext: no edema Skin: warm and dry Neuro: no focal abnormalities noted  EKG: .  EKG ordered today 12/06/15-sinus rhythm with no other abnormalities heart rate 73 bpm.01/01/14-74, sinus rhythm, nonspecific ST changes-previously Sinus rhythm rate 69 with no other abnormalities. No change from prior.  ASSESSMENT AND PLAN:  1. Palpitations-well controlled with metoprolol. No symptoms. Rare, wake at night heart racing. May be paroxysmal atrial tachycardia or PVCs. She is taking Toprol. Continue. 2. VSD-very small, perimembranous. Should be of no clinical consequence. 3. Hyperlipidemia-continue with Lescol. Monitored by Dr. Ihor DowNnodi. No changes made. 4. Mild mitral regurgitation- barely audible murmur. 5. She has gained back 10 pounds, good. She likely lost weight in the setting of her husband's illness and death in 2016. 6.  Diastolic dysfunction-mild. Currently no symptoms.  7. One-year follow-up  Signed, Donato SchultzMark Dashanique Brownstein, MD [facility]  12/06/2015 9:56 AM

## 2015-12-06 NOTE — Patient Instructions (Signed)

## 2015-12-30 ENCOUNTER — Ambulatory Visit: Payer: Medicare Other

## 2016-02-02 ENCOUNTER — Ambulatory Visit: Payer: Medicare Other

## 2016-03-14 ENCOUNTER — Ambulatory Visit
Admission: RE | Admit: 2016-03-14 | Discharge: 2016-03-14 | Disposition: A | Discharge status: Home / Self Care | Payer: Medicare Other | Source: Ambulatory Visit | Attending: Family Medicine | Admitting: Family Medicine

## 2016-03-14 DIAGNOSIS — Z1231 Encounter for screening mammogram for malignant neoplasm of breast: Secondary | ICD-10-CM

## 2016-09-18 ENCOUNTER — Telehealth: Payer: Self-pay | Admitting: Cardiology

## 2016-09-18 MED ORDER — METOPROLOL SUCCINATE ER 25 MG PO TB24: 25.0000 mg | ORAL_TABLET | Freq: Every day | ORAL | 1 refills | Status: DC

## 2016-09-18 NOTE — Telephone Encounter (Signed)
New message       *STAT* If patient is at the pharmacy, call can be transferred to refill team.   1. Which medications need to be refilled? (please list name of each medication and dose if known) metoprolol 25mg  2. Which pharmacy/location (including street and city if local pharmacy) is [facility]  3. Do they need a 30 day or 90 day supply? 90 day supply

## 2016-09-18 NOTE — Telephone Encounter (Signed)
Pt's medication was sent to pt's pharmacy as requested. Confirmation received.  °

## 2016-12-07 ENCOUNTER — Telehealth: Payer: Self-pay | Admitting: Cardiology

## 2016-12-07 NOTE — Telephone Encounter (Signed)
[facility] that should last her at least through 02/2017. She has an appointment in 01/2017. I tried to reach the patient but was not successful. Will try again later.

## 2016-12-07 NOTE — Telephone Encounter (Signed)
New Message   *STAT* If patient is at the pharmacy, call can be transferred to refill team.   1. Which medications need to be refilled? (please list name of each medication and dose if known) Metoprolol 25mg    2. Which pharmacy/location (including street and city if local pharmacy) [facility]  3. Do they need a 30 day or 90 day supply? 90

## 2016-12-07 NOTE — Telephone Encounter (Signed)
Medication Detail    Disp Refills Start End   metoprolol succinate (TOPROL-XL) 25 MG 24 hr tablet 90 tablet 1 09/18/2016    Sig - Route: Take 1 tablet (25 mg total) by mouth daily. - Oral   Sent to pharmacy as: metoprolol succinate (TOPROL-XL) 25 MG 24 hr tablet   Notes to Pharmacy: Please keep upcoming appointment for future refills. Thank you   E-Prescribing Status: Receipt confirmed by pharmacy (09/18/2016 1:04 PM EDT)   Pharmacy   [facility] - [place], [place] - [address]

## 2017-01-01 ENCOUNTER — Ambulatory Visit: Payer: Medicare Other | Admitting: Cardiology

## 2017-01-16 DIAGNOSIS — L309 Dermatitis, unspecified: Secondary | ICD-10-CM | POA: Insufficient documentation

## 2017-01-16 DIAGNOSIS — Z780 Asymptomatic menopausal state: Secondary | ICD-10-CM | POA: Insufficient documentation

## 2017-02-01 ENCOUNTER — Ambulatory Visit (INDEPENDENT_AMBULATORY_CARE_PROVIDER_SITE_OTHER): Payer: Medicare Other | Admitting: Cardiology

## 2017-02-01 ENCOUNTER — Encounter: Payer: Self-pay | Admitting: Cardiology

## 2017-02-01 ENCOUNTER — Encounter (INDEPENDENT_AMBULATORY_CARE_PROVIDER_SITE_OTHER): Payer: Self-pay

## 2017-02-01 VITALS — BP 98/70 | HR 69 | Ht 66.5 in | Wt 145.8 lb

## 2017-02-01 DIAGNOSIS — Q210 Ventricular septal defect: Secondary | ICD-10-CM

## 2017-02-01 DIAGNOSIS — R002 Palpitations: Secondary | ICD-10-CM

## 2017-02-01 DIAGNOSIS — E7800 Pure hypercholesterolemia, unspecified: Secondary | ICD-10-CM

## 2017-02-01 NOTE — Progress Notes (Signed)
[address] [place], [place]  [address] Phone: [phone] Fax:  [phone]  Date:  02/01/2017   ID:  Sandra NoelJane Aprea, DOB 1946/01/14, MRN [id number]  PCP:  Sigmund HazelMiller, Lisa, MD   History of Present Illness: Sandra Monroe is a 72 y.o. female here for follow-up for small perimembranous VSD, mild mitral regurgitation, palpitations.  Back in 2010 she had a nuclear stress test that was overall low risk.  Low-dose metoprolol controls palpitations. Lescol for hyperlipidemia.  Overall, she is doing very well without any complaints, no chest pain, no fevers, no syncope. Rare palpitations.  Husband died 2016. Had AFIB  02/01/17 - right foot pain, bunion.  No chest pain, no shortness of breath, no syncope, no bleeding.  Overall doing quite well.  Sometimes feels her palpitations when laying flat at night.  Wt Readings from Last 3 Encounters:  02/01/17 145 lb 12.8 oz (66.1 kg)  12/06/15 142 lb 3.2 oz (64.5 kg)  06/09/14 133 lb (60.3 kg)     Past Medical History:  Diagnosis Date  . Dermatitis   . Diastolic dysfunction   . Hyperlipidemia   . Hypothyroidism   . Menopause   . Mitral regurgitation 11/24/2012   Mild   . Ventricular septal defect (VSD), perimembranous    , Small    Past Surgical History:  Procedure Laterality Date  . CESAREAN SECTION    . COMBINED HYSTEROSCOPY DIAGNOSTIC / D&C      Current Outpatient Medications  Medication Sig Dispense Refill  . aspirin 81 MG tablet Take 81 mg by mouth daily.    . fluvastatin XL (LESCOL XL) 80 MG 24 hr tablet Take 80 mg by mouth daily.    . metoprolol succinate (TOPROL-XL) 25 MG 24 hr tablet Take 1 tablet (25 mg total) by mouth daily. 90 tablet 1  . Multiple Vitamin (MULTIVITAMIN) tablet Take 1 tablet by mouth daily.     No current facility-administered medications for this visit.     Allergies:   No Known Allergies FAMILY hx - Father pacer. Died in 4780's.  Social History:  The patient  reports that  has  never smoked. she has never used smokeless tobacco. She reports that she drinks about 1.2 oz of alcohol per week. She reports that she does not use drugs.   ROS:  Please see the history of present illness.   Chest pain, no orthopnea, no PND, no strokelike symptoms, no shortness of breath, no pulsations  PHYSICAL EXAM: VS:  BP 98/70   Pulse 69   Ht 5' 6.5" (1.689 m)   Wt 145 lb 12.8 oz (66.1 kg)   LMP  (LMP Unknown)   SpO2 97%   BMI 23.18 kg/m  GEN: Thin, well developed, in no acute distress  HEENT: normal  Neck: no JVD, carotid bruits, or masses Cardiac: RRR; barely audible systolic murmur, no rubs, or gallops,no edema  Respiratory:  clear to auscultation bilaterally, normal work of breathing GI: soft, nontender, nondistended, + BS MS: no deformity or atrophy  Skin: warm and dry, no rash Neuro:  Alert and Oriented x 3, Strength and sensation are intact Psych: euthymic mood, full affect   EKG: .  EKG ordered today 02/01/17-sinus rhythm with PVC 69 bpm personally viewed 12/06/15-sinus rhythm with no other abnormalities heart rate 73 bpm.01/01/14-74, sinus rhythm, nonspecific ST changes-previously Sinus rhythm rate 69 with no other abnormalities. No change from prior.  ASSESSMENT AND PLAN:  1. Palpitations-well controlled with metoprolol.  No symptoms. Rare, wake at night heart racing. May be paroxysmal atrial tachycardia or PVCs. She is taking Toprol. Continue.  PVC seen on ECG today.  Benign. 2. VSD-very small, perimembranous. Should be of no clinical consequence.  No changes.  Barely audible murmur. 3. Hyperlipidemia-continue with Lescol. Monitored by Dr. Ihor Dow. No changes made.  Stable, no myalgias 4. Mild mitral regurgitation- barely audible murmur.  Stable. 5. Diastolic dysfunction-mild. Currently no symptoms.  Doing very well. 6. One-year follow-up  Signed, Donato Schultz, MD [facility]  02/01/2017 9:24 AM

## 2017-02-01 NOTE — Patient Instructions (Signed)

## 2017-03-05 ENCOUNTER — Other Ambulatory Visit: Payer: Self-pay | Admitting: *Deleted

## 2017-03-05 MED ORDER — METOPROLOL SUCCINATE ER 25 MG PO TB24: 25.0000 mg | ORAL_TABLET | Freq: Every day | ORAL | 3 refills | Status: DC

## 2017-10-07 ENCOUNTER — Telehealth: Payer: Self-pay

## 2017-10-07 ENCOUNTER — Encounter: Payer: Self-pay | Admitting: Cardiology

## 2017-10-07 ENCOUNTER — Ambulatory Visit (INDEPENDENT_AMBULATORY_CARE_PROVIDER_SITE_OTHER): Payer: Medicare Other | Admitting: Cardiology

## 2017-10-07 VITALS — BP 120/60 | HR 87 | Ht 66.5 in | Wt 143.0 lb

## 2017-10-07 DIAGNOSIS — I209 Angina pectoris, unspecified: Secondary | ICD-10-CM | POA: Diagnosis not present

## 2017-10-07 DIAGNOSIS — R7989 Other specified abnormal findings of blood chemistry: Secondary | ICD-10-CM

## 2017-10-07 DIAGNOSIS — R002 Palpitations: Secondary | ICD-10-CM

## 2017-10-07 DIAGNOSIS — R0602 Shortness of breath: Secondary | ICD-10-CM | POA: Diagnosis not present

## 2017-10-07 DIAGNOSIS — R079 Chest pain, unspecified: Secondary | ICD-10-CM | POA: Diagnosis not present

## 2017-10-07 LAB — CBC WITH DIFFERENTIAL/PLATELET
BASOS ABS: 0 10*3/uL (ref 0.0–0.2)
Basos: 0 %
EOS (ABSOLUTE): 0.1 10*3/uL (ref 0.0–0.4)
Eos: 1 %
Hematocrit: 41.1 % (ref 34.0–46.6)
Hemoglobin: 13.6 g/dL (ref 11.1–15.9)
Immature Grans (Abs): 0 10*3/uL (ref 0.0–0.1)
Immature Granulocytes: 0 %
LYMPHS ABS: 1.6 10*3/uL (ref 0.7–3.1)
Lymphs: 29 %
MCH: 29.8 pg (ref 26.6–33.0)
MCHC: 33.1 g/dL (ref 31.5–35.7)
MCV: 90 fL (ref 79–97)
MONOCYTES: 9 %
Monocytes Absolute: 0.5 10*3/uL (ref 0.1–0.9)
NEUTROS ABS: 3.2 10*3/uL (ref 1.4–7.0)
Neutrophils: 61 %
PLATELETS: 270 10*3/uL (ref 150–450)
RBC: 4.56 x10E6/uL (ref 3.77–5.28)
RDW: 14.4 % (ref 12.3–15.4)
WBC: 5.3 10*3/uL (ref 3.4–10.8)

## 2017-10-07 LAB — BASIC METABOLIC PANEL
BUN/Creatinine Ratio: 22 (ref 12–28)
BUN: 14 mg/dL (ref 8–27)
CALCIUM: 9.9 mg/dL (ref 8.7–10.3)
CHLORIDE: 104 mmol/L (ref 96–106)
CO2: 23 mmol/L (ref 20–29)
CREATININE: 0.63 mg/dL (ref 0.57–1.00)
GFR calc Af Amer: 105 mL/min/{1.73_m2} (ref >59)
GFR calc non Af Amer: 91 mL/min/{1.73_m2} (ref >59)
GLUCOSE: 83 mg/dL (ref 65–99)
Potassium: 4.7 mmol/L (ref 3.5–5.2)
Sodium: 140 mmol/L (ref 134–144)

## 2017-10-07 LAB — TSH: TSH: 4.92 u[IU]/mL — AB (ref 0.450–4.500)

## 2017-10-07 MED ORDER — METOPROLOL TARTRATE 50 MG PO TABS: ORAL_TABLET | ORAL | 0 refills | Status: DC

## 2017-10-07 NOTE — Telephone Encounter (Signed)
Called patient with lab results. Per Dr. Anne FuSkains, TSH is mildly elevated. Let's add on Free T4. Other labs look good. Please send copy to [facility]. Patient verbalized understanding. Patient will come in tomorrow for Free T4.

## 2017-10-07 NOTE — Progress Notes (Signed)
[address] [place], [place]  [address] Phone: [phone] Fax:  [phone]  Date:  10/07/2017   ID:  Sandra Monroe, DOB 01/24/1945, MRN [id number]  PCP:  Sigmund HazelMiller, Lisa, MD   History of Present Illness: Sandra Monroe is a 72 y.o. female here for follow-up for small perimembranous VSD, mild mitral regurgitation, palpitations.  Back in 2010 she had a nuclear stress test that was overall low risk.  Low-dose metoprolol controls palpitations. Lescol for hyperlipidemia.  Overall, she is doing very well without any complaints, no chest pain, no fevers, no syncope. Rare palpitations.  Husband died 2016. Had AFIB  02/01/17 - right foot pain, bunion.  No chest pain, no shortness of breath, no syncope, no bleeding.  Overall doing quite well.  Sometimes feels her palpitations when laying flat at night.  10/07/17 - CC: here to evaluate SOB, Palps. Hard to get in deep breath. Could be at night, or driving. Read about dyspnea. Concerned. Worried. Husband passed away 3 away. Palps can come with that. Tooth ache. Pain underleft arm dull, left sided, moderate. Been going on for a long time. Bb.  Never smoked. Mother and Father - 1880's. No early CAD. Mother had CAD later in life.  70 year old son is in ICU. Fall brain surgery. ETOH. Alinon considering.    Wt Readings from Last 3 Encounters:  10/07/17 143 lb (64.9 kg)  02/01/17 145 lb 12.8 oz (66.1 kg)  12/06/15 142 lb 3.2 oz (64.5 kg)     Past Medical History:  Diagnosis Date  . Dermatitis   . Diastolic dysfunction   . Hyperlipidemia   . Hypothyroidism   . Menopause   . Mitral regurgitation 11/24/2012   Mild   . Ventricular septal defect (VSD), perimembranous    , Small    Past Surgical History:  Procedure Laterality Date  . CESAREAN SECTION    . COMBINED HYSTEROSCOPY DIAGNOSTIC / D&C      Current Outpatient Medications  Medication Sig Dispense Refill  . aspirin 81 MG tablet Take 81 mg by mouth daily.    .  fluvastatin XL (LESCOL XL) 80 MG 24 hr tablet Take 80 mg by mouth daily.    . metoprolol succinate (TOPROL-XL) 25 MG 24 hr tablet Take 1 tablet (25 mg total) by mouth daily. 90 tablet 3  . Multiple Vitamin (MULTIVITAMIN) tablet Take 1 tablet by mouth daily.    . metoprolol tartrate (LOPRESSOR) 50 MG tablet Take one tablet an hour before your CT scan. 1 tablet 0   No current facility-administered medications for this visit.     Allergies:   No Known Allergies FAMILY hx - Father pacer. Died in 8980's.  Social History:  The patient  reports that she has never smoked. She has never used smokeless tobacco. She reports that she drinks about 2.0 standard drinks of alcohol per week. She reports that she does not use drugs.   ROS:  Please see the history of present illness.   Chest pain, no orthopnea, no PND, no strokelike symptoms, no shortness of breath, no pulsations  PHYSICAL EXAM: VS:  BP 120/60   Pulse 87   Ht 5' 6.5" (1.689 m)   Wt 143 lb (64.9 kg)   LMP  (LMP Unknown)   SpO2 97%   BMI 22.74 kg/m  GEN: Thin, well developed, in no acute distress  HEENT: normal  Neck: no JVD, carotid bruits, or masses Cardiac: RRR; barely audible systolic  murmur, no rubs, or gallops,no edema  Respiratory:  clear to auscultation bilaterally, normal work of breathing GI: soft, nontender, nondistended, + BS MS: no deformity or atrophy  Skin: warm and dry, no rash Neuro:  Alert and Oriented x 3, Strength and sensation are intact Psych: euthymic mood, full affect   EKG: .  EKG ordered today 02/01/17-sinus rhythm with PVC 69 bpm personally viewed 12/06/15-sinus rhythm with no other abnormalities heart rate 73 bpm.01/01/14-74, sinus rhythm, nonspecific ST changes-previously Sinus rhythm rate 69 with no other abnormalities. No change from prior.  ASSESSMENT AND PLAN:  Chest pain/shortness of breath -This is been fairly long-standing but is making her nervous.  Her mother did have coronary artery disease  later in her life.  I would like to check an echocardiogram to ensure proper structure and function of her heart and to evaluate her prior small VSD, I do not hear a murmur.  I would also like to check a coronary CT scan with FFR analysis to evaluate for any significant stenoses.  Her chest discomfort, left-sided under her left arm, mid axillary line is fairly atypical however in combination with her sporadic shortness of breath which can occur with or without exertion this should be evaluated further.  We will give her an additional Lopressor 50 mg at time of scan.  She has never had any contrast allergies.  We will also check a CBC, basic meta Bolick profile, TSH to ensure that there are no significant abnormalities present.  This was last done in October 2018.  Palpitations - Overall periodic, fairly well controlled with Toprol.  PVCs previously seen on ECG.  Today no ectopy heard on exam.  VSD perimembranous -Very small of no clinical significance.  I do not hear a murmur today.  Hyperlipidemia - Fluvastatin, last LDL 80, HDL 63.  Mild mitral regurgitation -Once again barely audible murmur, stable.  Doing well.  Echocardiogram being checked again.  One year follow-up.  Signed, Donato Schultz, MD [facility]  10/07/2017 10:29 AM

## 2017-10-07 NOTE — Patient Instructions (Addendum)
Medication Instructions:  Your physician recommends that you continue on your current medications as directed. Please refer to the Current Medication list given to you today.  Labwork: Your physician recommends that you lab work today- BMET, CBC, and TSH  Testing/Procedures: Your physician has requested that you have cardiac CT. Cardiac computed tomography (CT) is a painless test that uses an x-ray machine to take clear, detailed pictures of your heart. For further information please visit [email or web address]. Please follow instruction sheet as given.  Your physician has requested that you have an echocardiogram. Echocardiography is a painless test that uses sound waves to create images of your heart. It provides your doctor with information about the size and shape of your heart and how well your heart's chambers and valves are working. This procedure takes approximately one hour. There are no restrictions for this procedure.  Follow-Up: Your physician wants you to follow-up in: 12 months with Dr. Anne FuSkains. You will receive a reminder letter in the mail two months in advance. If you don't receive a letter, please call our office to schedule the follow-up appointment.   If you need a refill on your cardiac medications before your next appointment, please call your pharmacy.  Please arrive at the [facility] main entrance [facility] at xx:xx AM (30-45 minutes prior to test start time)  [facility] [address] [place], [place] [address] [phone]  Proceed to the [facility]iology Department (First Floor).  Please follow these instructions carefully (unless otherwise directed):  Hold all erectile dysfunction medications at least 48 hours prior to test.  On the Night Before the Test: . Drink plenty of water. . Do not consume any caffeinated/decaffeinated beverages or chocolate 12 hours prior to your test. . Do not take any antihistamines 12 hours prior to  your test.   On the Day of the Test: . Drink plenty of water. Do not drink any water within one hour of the test. . Do not eat any food 4 hours prior to the test. . You may take your regular medications prior to the test. .   - Take 50 mg of lopressor (metoprolol) one hour before the test.   After the Test: . Drink plenty of water. . After receiving IV contrast, you may experience a mild flushed feeling. This is normal. . On occasion, you may experience a mild rash up to 24 hours after the test. This is not dangerous. If this occurs, you can take Benadryl 25 mg and increase your fluid intake. . If you experience trouble breathing, this can be serious. If it is severe call 911 IMMEDIATELY. If it is mild, please call our office.

## 2017-10-07 NOTE — Telephone Encounter (Signed)
-----   Message from Jake BatheMark C Skains, MD sent at 10/07/2017  5:18 PM EDT ----- TSH is mildly elevated. Let's add on Free T4. Other labs look good. Please send copy to Dr[facility]und [facility]elLisa Miller at [facility].  Thanks Donato SchultzMark Skains, MD

## 2017-10-08 ENCOUNTER — Other Ambulatory Visit: Payer: Medicare Other

## 2017-10-09 ENCOUNTER — Other Ambulatory Visit: Payer: Medicare Other | Admitting: *Deleted

## 2017-10-09 DIAGNOSIS — R7989 Other specified abnormal findings of blood chemistry: Secondary | ICD-10-CM

## 2017-10-09 DIAGNOSIS — R002 Palpitations: Secondary | ICD-10-CM

## 2017-10-10 LAB — T4, FREE: FREE T4: 0.97 ng/dL (ref 0.82–1.77)

## 2017-10-11 ENCOUNTER — Other Ambulatory Visit: Payer: Self-pay | Admitting: *Deleted

## 2017-10-11 ENCOUNTER — Other Ambulatory Visit: Payer: Self-pay

## 2017-10-11 ENCOUNTER — Ambulatory Visit (HOSPITAL_COMMUNITY): Payer: Medicare Other | Attending: Cardiology

## 2017-10-11 DIAGNOSIS — I340 Nonrheumatic mitral (valve) insufficiency: Secondary | ICD-10-CM | POA: Diagnosis not present

## 2017-10-11 DIAGNOSIS — R002 Palpitations: Secondary | ICD-10-CM | POA: Diagnosis not present

## 2017-10-11 DIAGNOSIS — R0602 Shortness of breath: Secondary | ICD-10-CM | POA: Insufficient documentation

## 2017-10-11 DIAGNOSIS — E039 Hypothyroidism, unspecified: Secondary | ICD-10-CM | POA: Insufficient documentation

## 2017-10-11 DIAGNOSIS — R0600 Dyspnea, unspecified: Secondary | ICD-10-CM | POA: Diagnosis not present

## 2017-10-11 DIAGNOSIS — R079 Chest pain, unspecified: Secondary | ICD-10-CM | POA: Insufficient documentation

## 2017-10-11 DIAGNOSIS — Q210 Ventricular septal defect: Secondary | ICD-10-CM | POA: Insufficient documentation

## 2017-10-11 DIAGNOSIS — E785 Hyperlipidemia, unspecified: Secondary | ICD-10-CM | POA: Diagnosis not present

## 2017-10-11 DIAGNOSIS — I209 Angina pectoris, unspecified: Secondary | ICD-10-CM | POA: Diagnosis present

## 2017-10-11 MED ORDER — METOPROLOL TARTRATE 50 MG PO TABS: ORAL_TABLET | ORAL | 0 refills | Status: DC

## 2017-11-18 ENCOUNTER — Ambulatory Visit (HOSPITAL_COMMUNITY): Admission: RE | Admit: 2017-11-18 | Payer: Medicare Other | Source: Ambulatory Visit

## 2017-11-18 ENCOUNTER — Encounter (HOSPITAL_COMMUNITY): Payer: Self-pay

## 2017-11-18 ENCOUNTER — Ambulatory Visit (HOSPITAL_COMMUNITY)
Admission: RE | Admit: 2017-11-18 | Discharge: 2017-11-18 | Disposition: A | Discharge status: Home / Self Care | Payer: Medicare Other | Source: Ambulatory Visit | Attending: Cardiology | Admitting: Cardiology

## 2017-11-18 DIAGNOSIS — I209 Angina pectoris, unspecified: Secondary | ICD-10-CM

## 2017-11-18 DIAGNOSIS — R079 Chest pain, unspecified: Secondary | ICD-10-CM | POA: Insufficient documentation

## 2017-11-18 DIAGNOSIS — R0602 Shortness of breath: Secondary | ICD-10-CM | POA: Insufficient documentation

## 2017-11-18 NOTE — Progress Notes (Signed)
   11/18/17 1226 11/18/17 1230 11/18/17 1235  Vital Signs  Pulse Rate 66 76 65  Pulse Rate Source Monitor Monitor Monitor  BP (!) 96/43 (!) 92/28 (!) 97/36  BP Location Left Arm Left Arm Right Arm  BP Method Automatic Automatic Automatic  Patient Position (if appropriate) Sitting Sitting Sitting   Pt. States she took 50 mg of Lopressor 1 hour before procedure as instructed. Heart rate above 60 but blood pressure low. Dr. Jacques Navy made aware of patient blood pressure readings. Unable to give patient nitroglycerin because of current blood pressure reading. Patient informed that we could proceed with study but results may be limited because of heart rate and unable to give full dose of nitroglycerin. Patient states she would rather discuss other possible options with Dr. Anne Fu and declined going further with CT heart study.

## 2018-02-18 ENCOUNTER — Other Ambulatory Visit: Payer: Self-pay | Admitting: Family Medicine

## 2018-02-18 DIAGNOSIS — E2839 Other primary ovarian failure: Secondary | ICD-10-CM

## 2018-03-06 ENCOUNTER — Telehealth: Payer: Self-pay | Admitting: *Deleted

## 2018-03-06 NOTE — Telephone Encounter (Signed)
Agree with plan to see her before CT. Donato SchultzMark Skains, MD

## 2018-03-06 NOTE — Telephone Encounter (Signed)
Pt was last seen by Dr Anne Fu 9/16 and was ordered to have a Coronary CT for the evaluation of long standing chest pain and SOB.  When pt was contacted to be scheduled, she cancelled the CT because she was "too busy" to have it completed.  She is now calling wanting the CT to be scheduled.  Advised since the order has expired and it's been 5 months since she has been seen, I will needs to review with Dr Anne Fu for a new order.  In the meanwhile, pt will be scheduled for a f/u appt with Dr Anne Fu.

## 2018-03-07 ENCOUNTER — Telehealth: Payer: Self-pay

## 2018-03-07 NOTE — Telephone Encounter (Signed)
Spoke to the patient and arranged an appt for her with Dr Anne Fu for 2/18 11:20 am to discuss Coronary CTA.  She verbalized understanding and thanked me for the call.

## 2018-03-07 NOTE — Telephone Encounter (Signed)
Spoke to patient

## 2018-03-10 ENCOUNTER — Encounter: Payer: Self-pay | Admitting: Cardiology

## 2018-03-11 ENCOUNTER — Ambulatory Visit (INDEPENDENT_AMBULATORY_CARE_PROVIDER_SITE_OTHER): Payer: Medicare Other | Admitting: Cardiology

## 2018-03-11 ENCOUNTER — Encounter: Payer: Self-pay | Admitting: Cardiology

## 2018-03-11 ENCOUNTER — Encounter: Payer: Self-pay | Admitting: *Deleted

## 2018-03-11 VITALS — BP 112/60 | HR 70 | Ht 66.5 in | Wt 150.2 lb

## 2018-03-11 DIAGNOSIS — R079 Chest pain, unspecified: Secondary | ICD-10-CM

## 2018-03-11 DIAGNOSIS — R0602 Shortness of breath: Secondary | ICD-10-CM | POA: Diagnosis not present

## 2018-03-11 DIAGNOSIS — Q210 Ventricular septal defect: Secondary | ICD-10-CM

## 2018-03-11 DIAGNOSIS — R002 Palpitations: Secondary | ICD-10-CM | POA: Diagnosis not present

## 2018-03-11 NOTE — Patient Instructions (Signed)
Medication Instructions:  The current medical regimen is effective;  continue present plan and medications.  If you need a refill on your cardiac medications before your next appointment, please call your pharmacy.   Testing/Procedures: Your physician has requested that you have en exercise stress myoview. For further information please visit [email or web address] Please follow instruction sheet, as given.  Follow-Up: At [facility], you and your health needs are our priority.  As part of our continuing mission to provide you with exceptional heart care, we have created designated Provider Care Teams.  These Care Teams include your primary Cardiologist (physician) and Advanced Practice Providers (APPs -  Physician Assistants and Nurse Practitioners) who all work together to provide you with the care you need, when you need it. You will need a follow up appointment in 12 months.  Please call our office 2 months in advance to schedule this appointment.  You may see Dr Donato Schultz or one of the following Advanced Practice Providers on your designated Care Team:   Norma Fredrickson, NP Nada Boozer, NP . Georgie Chard, NP

## 2018-03-11 NOTE — Progress Notes (Signed)
Cardiology Office Note:    Date:  03/11/2018   ID:  Sandra Monroe, DOB 08-25-1945, MRN [id number]  PCP:  Sigmund Hazel, MD  Cardiologist:  No primary care provider on file.  Electrophysiologist:  None   Referring MD: Sigmund Hazel, MD     History of Present Illness:    Sandra Monroe is a 73 y.o. female here for follow-up of small perimembranous VSD, mild mitral regurgitation and palpitations.  She was last seen here in the office on 10/07/2017 with some chest discomfort shortness of breath atypical left axillary at times left arm nonexertional.  We tried to set her up for a CT scan and she went to the hospital for this but her blood pressure was too low after taking the metoprolol addition.  The test was canceled.  This makes sense.  Also nitroglycerin given during that study would have likely reduced her blood pressure even more.  Because of this, we have opted to switch her over to a nuclear stress test.  Had some significant stressors in her life including husband who died in 28-May-2014, son who had a brain surgery in his 61s. Doing ok now. Brain injury.   Mother had CAD, 95% "blockage "at one point.   Now in town home. Downsize.   Denies any fevers chills nausea vomiting syncope bleeding   Past Medical History:  Diagnosis Date  . Dermatitis   . Diastolic dysfunction   . Hyperlipidemia   . Hypothyroidism   . Menopause   . Mitral regurgitation 11/24/2012   Mild   . Ventricular septal defect (VSD), perimembranous    , Small    Past Surgical History:  Procedure Laterality Date  . CESAREAN SECTION    . COMBINED HYSTEROSCOPY DIAGNOSTIC / D&C      Current Medications: Current Meds  Medication Sig  . aspirin 81 MG tablet Take 81 mg by mouth daily.  . fluvastatin XL (LESCOL XL) 80 MG 24 hr tablet Take 80 mg by mouth daily.  . metoprolol succinate (TOPROL-XL) 25 MG 24 hr tablet Take 1 tablet (25 mg total) by mouth daily.  . Multiple Vitamin (MULTIVITAMIN) tablet Take 1 tablet by  mouth daily.     Allergies:   Patient has no known allergies.   Social History   Socioeconomic History  . Marital status: Married    Spouse name: Not on file  . Number of children: Not on file  . Years of education: Not on file  . Highest education level: Not on file  Occupational History  . Not on file  Social Needs  . Financial resource strain: Not on file  . Food insecurity:    Worry: Not on file    Inability: Not on file  . Transportation needs:    Medical: Not on file    Non-medical: Not on file  Tobacco Use  . Smoking status: Never Smoker  . Smokeless tobacco: Never Used  Substance and Sexual Activity  . Alcohol use: Yes    Alcohol/week: 2.0 standard drinks    Types: 2 Glasses of wine per week    Comment: Every other day  . Drug use: No  . Sexual activity: Not on file  Lifestyle  . Physical activity:    Days per week: Not on file    Minutes per session: Not on file  . Stress: Not on file  Relationships  . Social connections:    Talks on phone: Not on file    Gets together: Not on file  Attends religious service: Not on file    Active member of club or organization: Not on file    Attends meetings of clubs or organizations: Not on file    Relationship status: Not on file  Other Topics Concern  . Not on file  Social History Narrative  . Not on file     Family History: The patient's family history includes Heart Problems in her father; Heart attack in an other family member; Hypertension in her mother; Thyroid disease in her mother. There is no history of Breast cancer.  ROS:   Please see the history of present illness.     All other systems reviewed and are negative.  EKGs/Labs/Other Studies Reviewed:    The following studies were reviewed today: Prior office note EKG lab work echo  EKG:  EKG is  ordered today.  The ekg ordered today demonstrates 03/11/2018-normal sinus rhythm 70 with no other abnormalities personally reviewed and interpreted.  No  significant change from prior.  Recent Labs: 10/07/2017: BUN 14; Creatinine, Ser 0.63; Hemoglobin 13.6; Platelets 270; Potassium 4.7; Sodium 140; TSH 4.920  Recent Lipid Panel No results found for: CHOL, TRIG, HDL, CHOLHDL, VLDL, LDLCALC, LDLDIRECT  Physical Exam:    VS:  BP 112/60   Pulse 70   Ht 5' 6.5" (1.689 m)   Wt 150 lb 3.2 oz (68.1 kg)   LMP  (LMP Unknown)   SpO2 95%   BMI 23.88 kg/m     Wt Readings from Last 3 Encounters:  03/11/18 150 lb 3.2 oz (68.1 kg)  10/07/17 143 lb (64.9 kg)  02/01/17 145 lb 12.8 oz (66.1 kg)     GEN:  Well nourished, well developed in no acute distress HEENT: Normal NECK: No JVD; No carotid bruits LYMPHATICS: No lymphadenopathy CARDIAC: RRR, no murmurs, rubs, gallops RESPIRATORY:  Clear to auscultation without rales, wheezing or rhonchi  ABDOMEN: Soft, non-tender, non-distended MUSCULOSKELETAL:  No edema; No deformity  SKIN: Warm and dry NEUROLOGIC:  Alert and oriented x 3 PSYCHIATRIC:  Normal affect   ASSESSMENT:    1. Chest pain, unspecified type   2. SOB (shortness of breath)   3. Heart palpitations   4. Ventricular septal defect (VSD), perimembranous    PLAN:    In order of problems listed above:  Atypical chest pain/shortness of breath -Instead of CT scan of coronary arteries which was canceled secondary to hypotension.  I think would be reasonable for [place] to do a treadmill nuclear stress test for her instead.  This will also give [place] EF information as well.  Perimembranous VSD -Very small on echocardiogram.  Continue to monitor.  Could be of no clinical significance.  Palpitations -Very periodic well controlled on Toprol.  Previously we saw PVCs on ECG.  No ectopy heard today.  EKG today normal.  Mild mitral regurgitation - No significant murmur heard.  Continue to monitor with echocardiogram.  Family history of CAD - Mother had previous CAD at a later age.  1 year follow-up.  We will follow-up with results of stress  testing.   Medication Adjustments/Labs and Tests Ordered: Current medicines are reviewed at length with the patient today.  Concerns regarding medicines are outlined above.  Orders Placed This Encounter  Procedures  . MYOCARDIAL PERFUSION IMAGING  . EKG 12-Lead   No orders of the defined types were placed in this encounter.   Patient Instructions  Medication Instructions:  The current medical regimen is effective;  continue present plan and medications.  If  you need a refill on your cardiac medications before your next appointment, please call your pharmacy.   Testing/Procedures: Your physician has requested that you have en exercise stress myoview. For further information please visit [email or web address]. Please follow instruction sheet, as given.  Follow-Up: At [facility], you and your health needs are our priority.  As part of our continuing mission to provide you with exceptional heart care, we have created designated Provider Care Teams.  These Care Teams include your primary Cardiologist (physician) and Advanced Practice Providers (APPs -  Physician Assistants and Nurse Practitioners) who all work together to provide you with the care you need, when you need it. You will need a follow up appointment in 12 months.  Please call our office 2 months in advance to schedule this appointment.  You may see Dr Donato SchultzMark Navjot Loera or one of the following Advanced Practice Providers on your designated Care Team:   Norma FredricksonLori Gerhardt, NP Nada BoozerLaura Ingold, NP . Georgie ChardJill McDaniel, NP      Signed, Donato SchultzMark Karisa Nesser, MD  03/11/2018 12:24 PM    [facility]

## 2018-03-12 ENCOUNTER — Telehealth (HOSPITAL_COMMUNITY): Payer: Self-pay | Admitting: *Deleted

## 2018-03-12 NOTE — Telephone Encounter (Signed)
Patient given detailed instructions per Myocardial Perfusion Study Information Sheet for the test on 03/17/18 at 1045. Patient notified to arrive 15 minutes early and that it is imperative to arrive on time for appointment to keep from having the test rescheduled.  If you need to cancel or reschedule your appointment, please call the office within 24 hours of your appointment. . Patient verbalized understanding.[name], Adelene Idler No mychart app

## 2018-03-17 ENCOUNTER — Ambulatory Visit (HOSPITAL_COMMUNITY): Payer: Medicare Other | Attending: Cardiology

## 2018-03-17 DIAGNOSIS — R079 Chest pain, unspecified: Secondary | ICD-10-CM | POA: Diagnosis not present

## 2018-03-17 DIAGNOSIS — R0602 Shortness of breath: Secondary | ICD-10-CM | POA: Insufficient documentation

## 2018-03-17 LAB — MYOCARDIAL PERFUSION IMAGING
CHL CUP NUCLEAR SDS: 2
CHL CUP NUCLEAR SRS: 1
CHL CUP RESTING HR STRESS: 69 {beats}/min
CSEPEW: 6.1 METS
CSEPHR: 93 %
CSEPPHR: 139 {beats}/min
Exercise duration (min): 4 min
Exercise duration (sec): 15 s
LV sys vol: 16 mL
LVDIAVOL: 47 mL (ref 46–106)
MPHR: 149 {beats}/min
RPE: 18
SSS: 3
TID: 0.96

## 2018-03-17 MED ORDER — TECHNETIUM TC 99M TETROFOSMIN IV KIT
31.9000 | PACK | Freq: Once | INTRAVENOUS | Status: AC | PRN
  Administered 2018-03-17: 31.9 via INTRAVENOUS
  Filled 2018-03-17: qty 32

## 2018-03-17 MED ORDER — TECHNETIUM TC 99M TETROFOSMIN IV KIT
9.8000 | PACK | Freq: Once | INTRAVENOUS | Status: AC | PRN
  Administered 2018-03-17: 9.8 via INTRAVENOUS
  Filled 2018-03-17: qty 10

## 2018-04-04 ENCOUNTER — Telehealth: Payer: Self-pay | Admitting: Cardiology

## 2018-04-04 ENCOUNTER — Other Ambulatory Visit: Payer: Self-pay | Admitting: *Deleted

## 2018-04-04 MED ORDER — METOPROLOL SUCCINATE ER 25 MG PO TB24: 25.0000 mg | ORAL_TABLET | Freq: Every day | ORAL | 3 refills | Status: DC

## 2018-04-04 NOTE — Telephone Encounter (Signed)
   *  STAT* If patient is at the pharmacy, call can be transferred to refill team.   1. Which medications need to be refilled? (please list name of each medication and dose if known)   metoprolol succinate (TOPROL XL) 25 MG 24 hr tablet    2. Which pharmacy/location (including street and city if local pharmacy) is medication to be sent to? [facility] - [place], [place] - [address]  3. Do they need a 30 day or 90 day supply? 90 days  

## 2018-06-04 ENCOUNTER — Other Ambulatory Visit: Payer: Medicare Other

## 2018-08-11 ENCOUNTER — Ambulatory Visit
Admission: RE | Admit: 2018-08-11 | Discharge: 2018-08-11 | Disposition: A | Discharge status: Home / Self Care | Payer: BC Managed Care – PPO | Source: Ambulatory Visit | Attending: Family Medicine | Admitting: Family Medicine

## 2018-08-11 ENCOUNTER — Other Ambulatory Visit: Payer: Self-pay

## 2018-08-11 DIAGNOSIS — E2839 Other primary ovarian failure: Secondary | ICD-10-CM

## 2018-11-19 ENCOUNTER — Other Ambulatory Visit: Payer: Self-pay | Admitting: Family Medicine

## 2018-11-19 DIAGNOSIS — Z1231 Encounter for screening mammogram for malignant neoplasm of breast: Secondary | ICD-10-CM

## 2018-11-21 ENCOUNTER — Other Ambulatory Visit: Payer: Self-pay

## 2018-11-21 DIAGNOSIS — Z20822 Contact with and (suspected) exposure to covid-19: Secondary | ICD-10-CM

## 2018-11-23 LAB — NOVEL CORONAVIRUS, NAA: SARS-CoV-2, NAA: NOT DETECTED

## 2019-01-09 ENCOUNTER — Ambulatory Visit: Payer: BC Managed Care – PPO

## 2019-02-25 ENCOUNTER — Other Ambulatory Visit: Payer: Self-pay

## 2019-02-25 ENCOUNTER — Ambulatory Visit
Admission: RE | Admit: 2019-02-25 | Discharge: 2019-02-25 | Disposition: A | Discharge status: Home / Self Care | Payer: Medicare Other | Source: Ambulatory Visit | Attending: Family Medicine | Admitting: Family Medicine

## 2019-02-25 DIAGNOSIS — Z1231 Encounter for screening mammogram for malignant neoplasm of breast: Secondary | ICD-10-CM

## 2019-04-22 ENCOUNTER — Other Ambulatory Visit: Payer: Self-pay

## 2019-04-22 MED ORDER — METOPROLOL SUCCINATE ER 25 MG PO TB24: 25.0000 mg | ORAL_TABLET | Freq: Every day | ORAL | 3 refills | Status: DC

## 2019-07-01 ENCOUNTER — Encounter: Payer: Self-pay | Admitting: Cardiology

## 2019-12-11 ENCOUNTER — Ambulatory Visit: Payer: Medicare Other | Admitting: Cardiology

## 2019-12-30 ENCOUNTER — Ambulatory Visit (INDEPENDENT_AMBULATORY_CARE_PROVIDER_SITE_OTHER): Payer: Medicare Other | Admitting: Cardiology

## 2019-12-30 ENCOUNTER — Other Ambulatory Visit: Payer: Self-pay

## 2019-12-30 ENCOUNTER — Encounter: Payer: Self-pay | Admitting: Cardiology

## 2019-12-30 VITALS — BP 112/70 | HR 93 | Ht 66.5 in | Wt 148.0 lb

## 2019-12-30 DIAGNOSIS — R002 Palpitations: Secondary | ICD-10-CM

## 2019-12-30 DIAGNOSIS — Q210 Ventricular septal defect: Secondary | ICD-10-CM

## 2019-12-30 NOTE — Patient Instructions (Signed)
Medication Instructions:  Your physician recommends that you continue on your current medications as directed. Please refer to the Current Medication list given to you today.  *If you need a refill on your cardiac medications before your next appointment, please call your pharmacy*   Lab Work: None Ordered If you have labs (blood work) drawn today and your tests are completely normal, you will receive your results only by: . MyChart Message (if you have MyChart) OR . A paper copy in the mail If you have any lab test that is abnormal or we need to change your treatment, we will call you to review the results.    Testing/Procedures: None Ordered    Follow-Up: At [facility], you and your health needs are our priority.  As part of our continuing mission to provide you with exceptional heart care, we have created designated Provider Care Teams.  These Care Teams include your primary Cardiologist (physician) and Advanced Practice Providers (APPs -  Physician Assistants and Nurse Practitioners) who all work together to provide you with the care you need, when you need it.  We recommend signing up for the patient portal called "MyChart".  Sign up information is provided on this After Visit Summary.  MyChart is used to connect with patients for Virtual Visits (Telemedicine).  Patients are able to view lab/test results, encounter notes, upcoming appointments, etc.  Non-urgent messages can be sent to your provider as well.   To learn more about what you can do with MyChart, go to [email or web address].    Your next appointment:   1 year(s)  The format for your next appointment:   In Person  Provider:   You may see Mark Skains, MD or one of the following Advanced Practice Providers on your designated Care Team:    Lori Gerhardt, NP  Laura Ingold, NP  Jill McDaniel, NP     

## 2019-12-30 NOTE — Progress Notes (Signed)
Cardiology Office Note:    Date:  12/30/2019   ID:  Sandra Monroe, DOB 1945-01-25, MRN [id number]  PCP:  Sigmund Hazel, MD  [facility] Cardiologist:  Donato Schultz, MD  [facility] Electrophysiologist:  None   Referring MD: Sigmund Hazel, MD     History of Present Illness:    Sandra Monroe is a 74 y.o. female here for follow-up of small perimembranous VSD with mild mitral regurgitation and palpitations.  Mother had CAD, 95% blockage at 1 point.  Husband died in 27-May-2014 Son had brain surgery in his 40s.  Overall will still have occasional palpitations mostly at night, sometimes she wakes up feeling her heart "racing ". Dr. Hyacinth Meeker now placed on thyroid medication.  This will likely help with palpitations as well.  Denies any fevers chills nausea vomiting syncope bleeding  Past Medical History:  Diagnosis Date  . Dermatitis   . Diastolic dysfunction   . Hyperlipidemia   . Hypothyroidism   . Menopause   . Mitral regurgitation 11/24/2012   Mild   . Ventricular septal defect (VSD), perimembranous    , Small    Past Surgical History:  Procedure Laterality Date  . CESAREAN SECTION    . COMBINED HYSTEROSCOPY DIAGNOSTIC / D&C      Current Medications: Current Meds  Medication Sig  . fluvastatin XL (LESCOL XL) 80 MG 24 hr tablet Take 80 mg by mouth daily.  . metoprolol succinate (TOPROL-XL) 25 MG 24 hr tablet Take 1 tablet (25 mg total) by mouth daily.  . Multiple Vitamin (MULTIVITAMIN) tablet Take 1 tablet by mouth daily.  Marland Kitchen SYNTHROID 25 MCG tablet Take 25 mcg by mouth daily.     Allergies:   Patient has no known allergies.   Social History   Socioeconomic History  . Marital status: Married    Spouse name: Not on file  . Number of children: Not on file  . Years of education: Not on file  . Highest education level: Not on file  Occupational History  . Not on file  Tobacco Use  . Smoking status: Never Smoker  . Smokeless tobacco: Never Used  Vaping Use  .  Vaping Use: Never used  Substance and Sexual Activity  . Alcohol use: Yes    Alcohol/week: 2.0 standard drinks    Types: 2 Glasses of wine per week    Comment: Every other day  . Drug use: No  . Sexual activity: Not on file  Other Topics Concern  . Not on file  Social History Narrative  . Not on file   Social Determinants of Health   Financial Resource Strain:   . Difficulty of Paying Living Expenses: Not on file  Food Insecurity:   . Worried About Programme researcher, broadcasting/film/video in the Last Year: Not on file  . Ran Out of Food in the Last Year: Not on file  Transportation Needs:   . Lack of Transportation (Medical): Not on file  . Lack of Transportation (Non-Medical): Not on file  Physical Activity:   . Days of Exercise per Week: Not on file  . Minutes of Exercise per Session: Not on file  Stress:   . Feeling of Stress : Not on file  Social Connections:   . Frequency of Communication with Friends and Family: Not on file  . Frequency of Social Gatherings with Friends and Family: Not on file  . Attends Religious Services: Not on file  . Active Member of Clubs or Organizations: Not on file  .  Attends Banker Meetings: Not on file  . Marital Status: Not on file     Family History: The patient's family history includes Heart Problems in her father; Heart attack in an other family member; Hypertension in her mother; Thyroid disease in her mother. There is no history of Breast cancer.  ROS:   Please see the history of present illness.     All other systems reviewed and are negative.  EKGs/Labs/Other Studies Reviewed:    The following studies were reviewed today:  Nuclear stress test 03/17/2018:  There was 1.39mm of J point depression with horizontal to upsloping ST segement depression in the inferolateral leads at peak exercise which rapidly resolved in recovery.  Nuclear stress EF: 65%.  Blood pressure demonstrated a normal response to exercise.  The study is  normal.  This is a low risk study.  The left ventricular ejection fraction is normal (55-65%).  ECHO 2019  - Left ventricle: The cavity size was normal. There was mild  concentric hypertrophy. Systolic function was normal. The  estimated ejection fraction was in the range of 60% to 65%. Wall  motion was normal; there were no regional wall motion  abnormalities. Doppler parameters are consistent with abnormal  left ventricular relaxation (grade 1 diastolic dysfunction).  There was no evidence of elevated ventricular filling pressure by  Doppler parameters.  - Aortic root: The aortic root was normal in size.  - Mitral valve: There was mild regurgitation.  - Right ventricle: The cavity size was normal. Wall thickness was  normal. Systolic function was normal.  - Pulmonic valve: There was no regurgitation.  - Pulmonary arteries: Systolic pressure was at the upper limits of  normal. PA peak pressure: 31 mm Hg (S).  - Inferior vena cava: The vessel was normal in size.  - Pericardium, extracardiac: There was no pericardial effusion.    EKG:  EKG is  ordered today.  The ekg ordered today demonstrates sinus rhythm 93 with nonspecific ST-T wave changes  Recent Labs: No results found for requested labs within last 8760 hours.  Recent Lipid Panel No results found for: CHOL, TRIG, HDL, CHOLHDL, VLDL, LDLCALC, LDLDIRECT   Risk Assessment/Calculations:       Physical Exam:    VS:  BP 112/70 (BP Location: Left Arm, Patient Position: Sitting, Cuff Size: Normal)   Pulse 93   Ht 5' 6.5" (1.689 m)   Wt 148 lb (67.1 kg)   LMP  (LMP Unknown)   SpO2 97%   BMI 23.53 kg/m     Wt Readings from Last 3 Encounters:  12/30/19 148 lb (67.1 kg)  03/17/18 150 lb (68 kg)  03/11/18 150 lb 3.2 oz (68.1 kg)     GEN:  Well nourished, well developed in no acute distress HEENT: Normal NECK: No JVD; No carotid bruits LYMPHATICS: No lymphadenopathy CARDIAC: RRR, no murmurs, rubs,  gallops RESPIRATORY:  Clear to auscultation without rales, wheezing or rhonchi  ABDOMEN: Soft, non-tender, non-distended MUSCULOSKELETAL:  No edema; No deformity  SKIN: Warm and dry NEUROLOGIC:  Alert and oriented x 3 PSYCHIATRIC:  Normal affect   ASSESSMENT:    1. Heart palpitations   2. Ventricular septal defect (VSD), perimembranous    PLAN:    In order of problems listed above:  Atypical chest pain -Previously evaluated in 2020 with nuclear stress test which was low risk with no ischemia.  Previously did not tolerate CT scan of coronaries because of low blood pressure  Perimembranous VSD -Very small  on echocardiogram.  Should be of no clinical significance.  Palpitations -Periodic, controlled overall on Toprol. -Sometimes will wake her up at night.  No high risk symptoms such as syncope.  Mild mitral regurgitation -No significant murmur appreciated on exam.  Continue to monitor periodically with echocardiogram.  Family history of CAD -Mother had CAD at later stages in life.  Continue to monitor for prevention efforts.  1 year follow-up    Shared Decision Making/Informed Consent        Medication Adjustments/Labs and Tests Ordered: Current medicines are reviewed at length with the patient today.  Concerns regarding medicines are outlined above.  Orders Placed This Encounter  Procedures  . EKG 12-Lead   No orders of the defined types were placed in this encounter.   Patient Instructions  Medication Instructions:  Your physician recommends that you continue on your current medications as directed. Please refer to the Current Medication list given to you today.  *If you need a refill on your cardiac medications before your next appointment, please call your pharmacy*   Lab Work: None Ordered If you have labs (blood work) drawn today and your tests are completely normal, you will receive your results only by: Marland Kitchen MyChart Message (if you have MyChart) OR . A  paper copy in the mail If you have any lab test that is abnormal or we need to change your treatment, we will call you to review the results.   Testing/Procedures: None Ordered   Follow-Up: At [facility], you and your health needs are our priority.  As part of our continuing mission to provide you with exceptional heart care, we have created designated Provider Care Teams.  These Care Teams include your primary Cardiologist (physician) and Advanced Practice Providers (APPs -  Physician Assistants and Nurse Practitioners) who all work together to provide you with the care you need, when you need it.  We recommend signing up for the patient portal called "MyChart".  Sign up information is provided on this After Visit Summary.  MyChart is used to connect with patients for Virtual Visits (Telemedicine).  Patients are able to view lab/test results, encounter notes, upcoming appointments, etc.  Non-urgent messages can be sent to your provider as well.   To learn more about what you can do with MyChart, go to [email or web address].    Your next appointment:   1 year(s)  The format for your next appointment:   In Person  Provider:   You may see Donato Schultz, MD or one of the following Advanced Practice Providers on your designated Care Team:    Norma Fredrickson, NP  Nada Boozer, NP  Georgie Chard, NP        Signed, Donato Schultz, MD  12/30/2019 12:13 PM    [facility]

## 2020-05-24 ENCOUNTER — Other Ambulatory Visit: Payer: Self-pay | Admitting: Cardiology

## 2020-07-01 IMAGING — MG DIGITAL SCREENING BILAT W/ TOMO W/ CAD
8 series · 9 of 24 positions shown · non-contrast
Comparison: Previous exam(s).

CLINICAL DATA: Screening.

EXAM:
DIGITAL SCREENING BILATERAL MAMMOGRAM WITH TOMO AND CAD

[L MLO synth-2D]
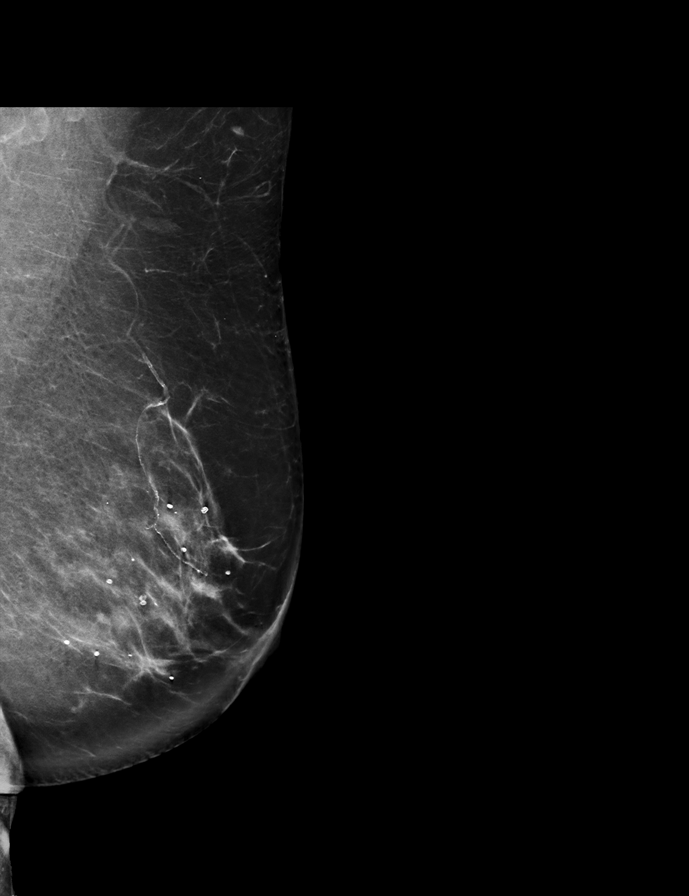

[R CC synth-2D]
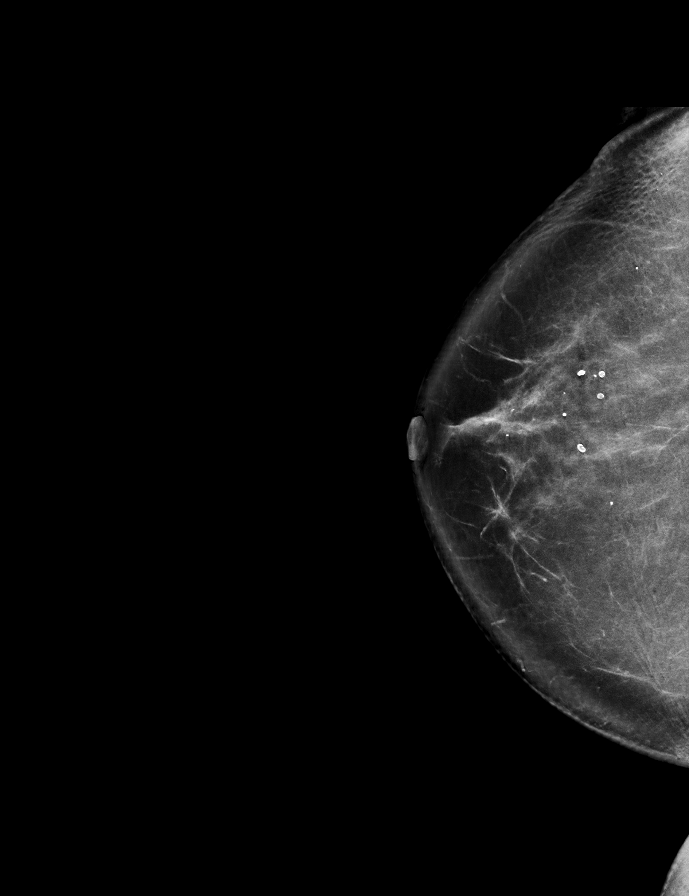

[L CC synth-2D]
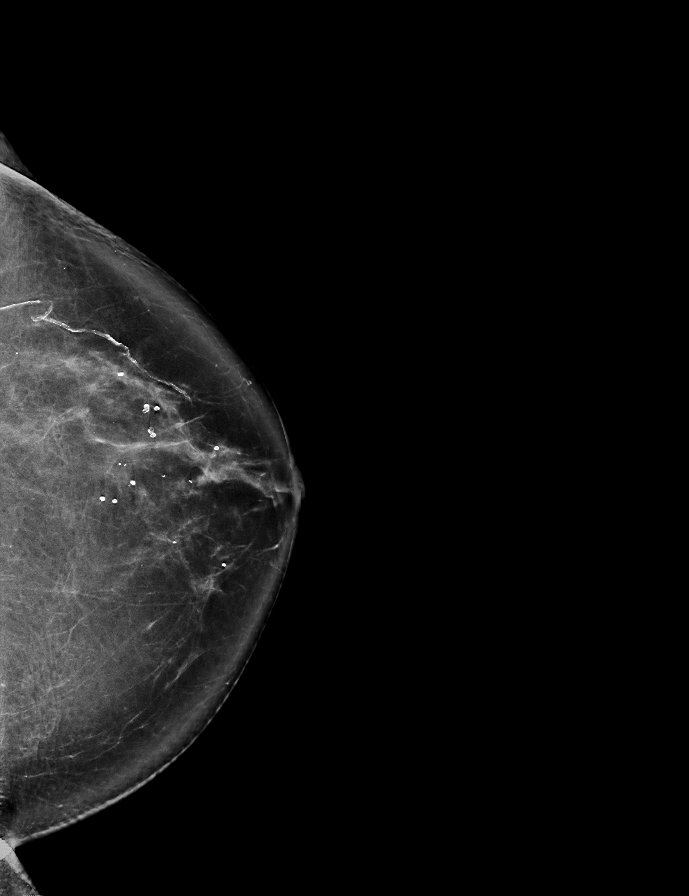

[R MLO synth-2D]
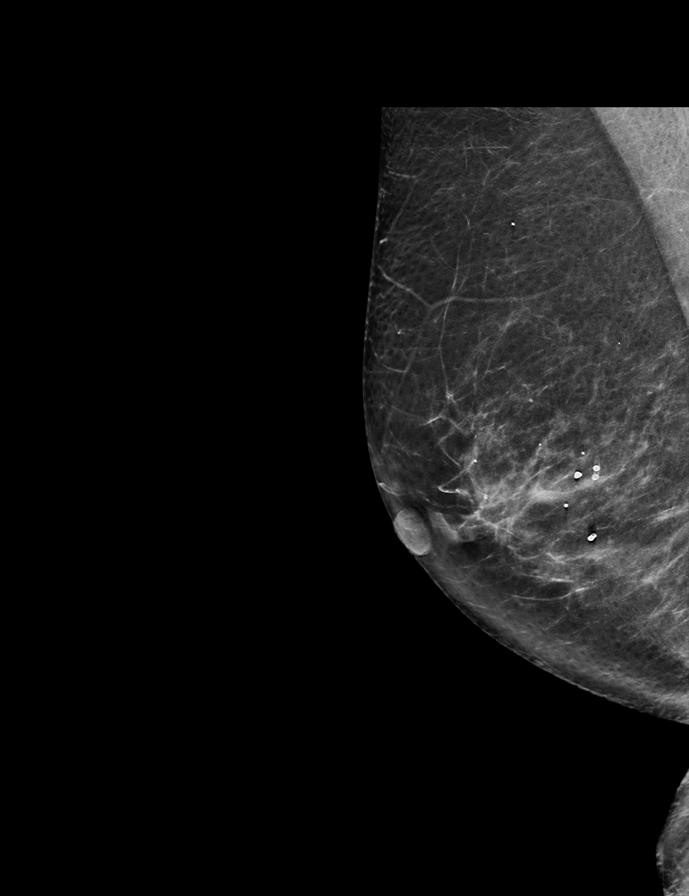

[L CC tomo · 2 of 80 frames shown]
[frame 26/80]
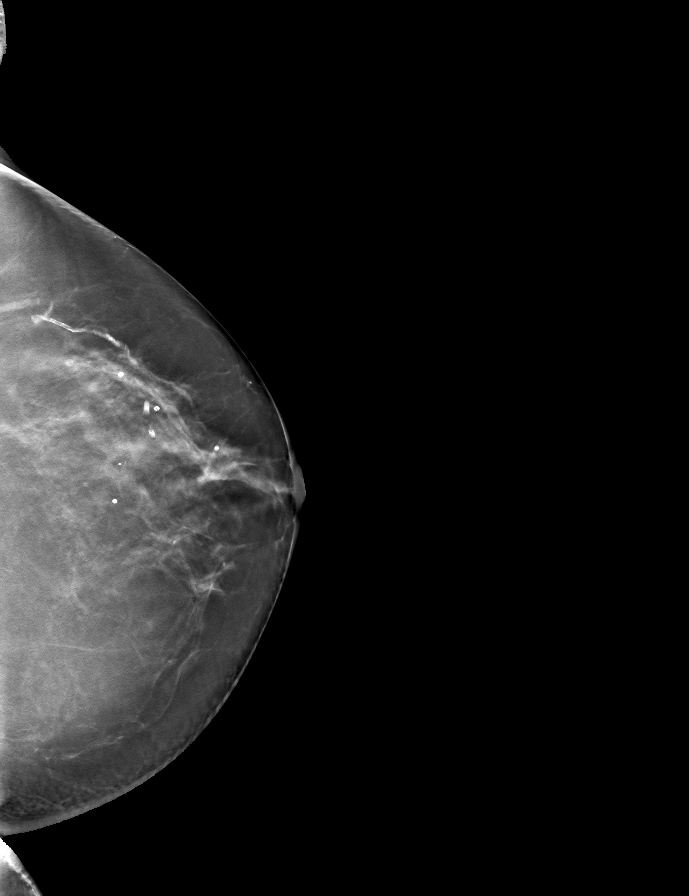
[frame 41/80]
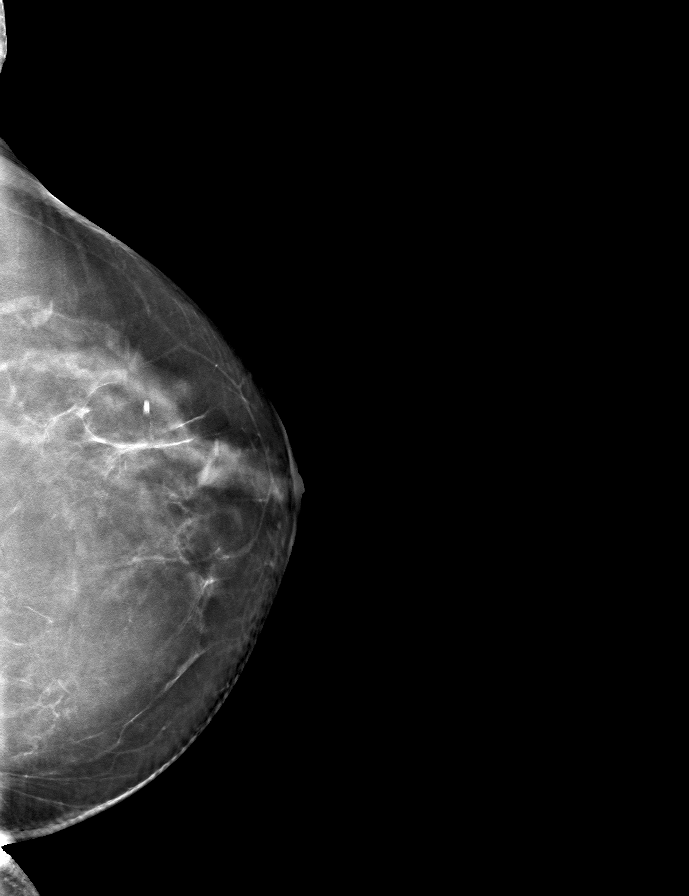

[R MLO tomo · tomo slice 37/74.0]
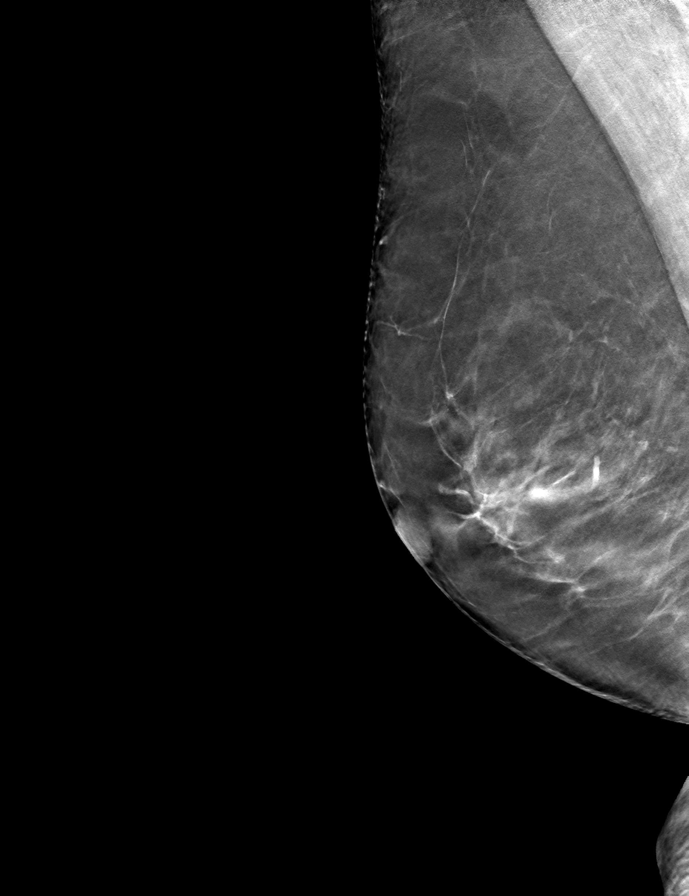

[R CC tomo · tomo slice 43/86.0]
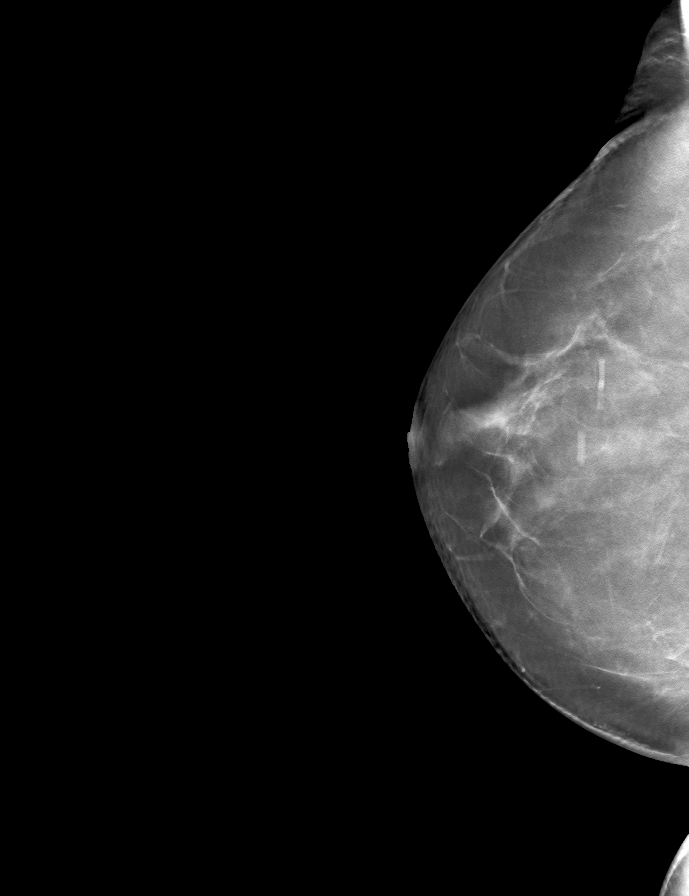

[L MLO tomo · tomo slice 41/82.0]
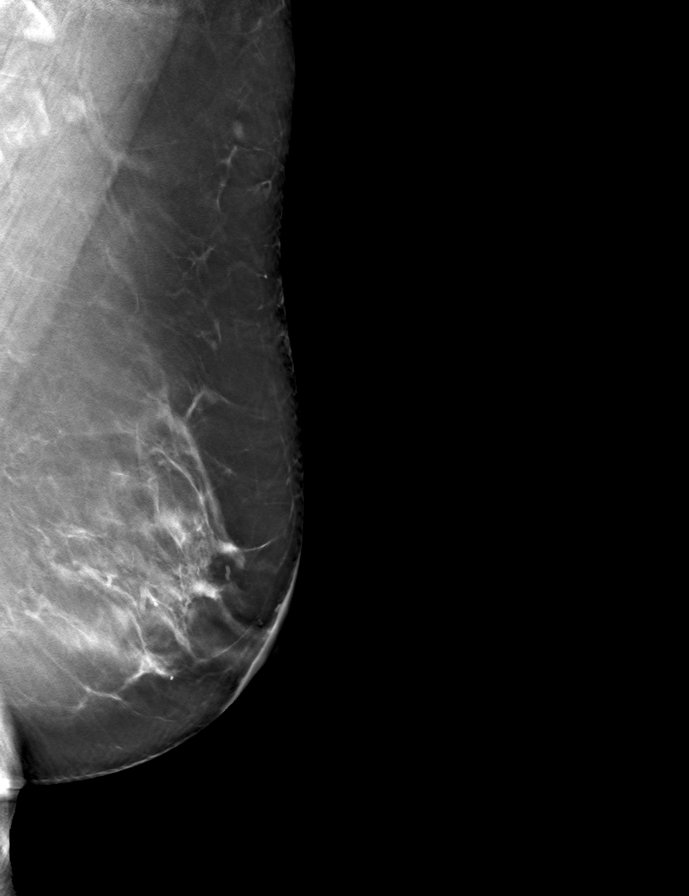

[9 of 24 positions shown; findings below may reference images not displayed]

ACR Breast Density Category c: The breast tissue is heterogeneously
dense, which may obscure small masses.
FINDINGS: There are no findings suspicious for malignancy. Images were
processed with CAD.
IMPRESSION: No mammographic evidence of malignancy. A result letter of this
screening mammogram will be mailed directly to the patient.

RECOMMENDATION:
Screening mammogram in one year. (Code:[id number])

BI-RADS CATEGORY  1: Negative.

## 2020-09-02 ENCOUNTER — Other Ambulatory Visit: Payer: Self-pay | Admitting: Home Modifications

## 2020-09-02 ENCOUNTER — Ambulatory Visit
Admission: RE | Admit: 2020-09-02 | Discharge: 2020-09-02 | Disposition: A | Discharge status: Home / Self Care | Payer: Medicare Other | Source: Ambulatory Visit | Attending: Home Modifications | Admitting: Home Modifications

## 2020-09-02 ENCOUNTER — Other Ambulatory Visit: Payer: Self-pay

## 2020-09-02 DIAGNOSIS — M25552 Pain in left hip: Secondary | ICD-10-CM

## 2020-11-28 ENCOUNTER — Other Ambulatory Visit: Payer: Self-pay | Admitting: Family Medicine

## 2020-11-28 DIAGNOSIS — Z1231 Encounter for screening mammogram for malignant neoplasm of breast: Secondary | ICD-10-CM

## 2021-01-31 ENCOUNTER — Ambulatory Visit: Payer: Medicare Other

## 2021-03-07 ENCOUNTER — Telehealth: Payer: Self-pay | Admitting: Cardiology

## 2021-03-07 MED ORDER — METOPROLOL SUCCINATE ER 25 MG PO TB24: 25.0000 mg | ORAL_TABLET | Freq: Every day | ORAL | 0 refills | Status: DC

## 2021-03-07 NOTE — Telephone Encounter (Signed)
Pt's medication was sent to pt's pharmacy as requested. Confirmation received.  °

## 2021-03-07 NOTE — Addendum Note (Signed)
Addended by: Margaret Pyle D on: 03/07/2021 03:59 PM   Modules accepted: Orders

## 2021-03-07 NOTE — Telephone Encounter (Signed)
*  STAT* If patient is at the pharmacy, call can be transferred to refill team.   1. Which medications need to be refilled? (please list name of each medication and dose if known) metoprolol succinate (TOPROL-XL) 25 MG 24 hr tablet   2. Which pharmacy/location (including street and city if local pharmacy) is medication to be sent to? [facility] - [place], [place] - [address]    3. Do they need a 30 day or 90 day supply? 90   

## 2021-04-07 ENCOUNTER — Other Ambulatory Visit: Payer: Self-pay

## 2021-04-07 ENCOUNTER — Ambulatory Visit (INDEPENDENT_AMBULATORY_CARE_PROVIDER_SITE_OTHER): Payer: Medicare Other | Admitting: Cardiology

## 2021-04-07 ENCOUNTER — Encounter: Payer: Self-pay | Admitting: Cardiology

## 2021-04-07 VITALS — BP 110/68 | HR 87 | Ht 66.0 in | Wt 148.0 lb

## 2021-04-07 DIAGNOSIS — I340 Nonrheumatic mitral (valve) insufficiency: Secondary | ICD-10-CM | POA: Diagnosis not present

## 2021-04-07 DIAGNOSIS — Q210 Ventricular septal defect: Secondary | ICD-10-CM

## 2021-04-07 DIAGNOSIS — R002 Palpitations: Secondary | ICD-10-CM

## 2021-04-07 DIAGNOSIS — E7800 Pure hypercholesterolemia, unspecified: Secondary | ICD-10-CM

## 2021-04-07 NOTE — Assessment & Plan Note (Signed)
Very small perimembranous VSD.  Last echo in 2019 overall showed normal function.  Excellent.  Next year, we will check another echocardiogram ?

## 2021-04-07 NOTE — Progress Notes (Signed)
?Cardiology Office Note:   ? ?Date:  04/07/2021  ? ?ID:  Sandra Monroe, DOB September 28, 1945, MRN [id number] ? ?PCP:  Kathyrn Lass, MD ?  ?[facility] Providers ?Cardiologist:  Candee Furbish, MD    ? ?Referring MD: Kathyrn Lass, MD  ? ? ?History of Present Illness:   ? ?Sandra Monroe is a 76 y.o. 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 ? ?Mother had coronary disease with 95% blockage at 1 point. ? ?Her husband died in May 23, 2014, son had brain surgery in his 82s. ? ?Main issue at this time is joint pain especially in her ankles.  This has been bothering her recently.  She describes no chest pain no shortness of breath. ? ?Past Medical History:  ?Diagnosis Date  ? Dermatitis   ? Diastolic dysfunction   ? Hyperlipidemia   ? Hypothyroidism   ? Menopause   ? Mitral regurgitation 11/24/2012  ? Mild   ? Ventricular septal defect (VSD), perimembranous   ? , Small  ? ? ?Past Surgical History:  ?Procedure Laterality Date  ? CESAREAN SECTION    ? COMBINED HYSTEROSCOPY DIAGNOSTIC / D&C    ? ? ?Current Medications: ?Current Meds  ?Medication Sig  ? fluvastatin XL (LESCOL XL) 80 MG 24 hr tablet Take 80 mg by mouth daily.  ? metoprolol succinate (TOPROL-XL) 25 MG 24 hr tablet Take 1 tablet (25 mg total) by mouth daily.  ? Multiple Vitamin (MULTIVITAMIN) tablet Take 1 tablet by mouth daily.  ? SYNTHROID 25 MCG tablet Take 25 mcg by mouth daily.  ?  ? ?Allergies:   Patient has no known allergies.  ? ?Social History  ? ?Socioeconomic History  ? Marital status: Married  ?  Spouse name: Not on file  ? Number of children: Not on file  ? Years of education: Not on file  ? Highest education level: Not on file  ?Occupational History  ? Not on file  ?Tobacco Use  ? Smoking status: Never  ? Smokeless tobacco: Never  ?Vaping Use  ? Vaping Use: Never used  ?Substance and Sexual Activity  ? Alcohol use: Yes  ?  Alcohol/week: 2.0 standard drinks  ?  Types: 2 Glasses of wine per week  ?  Comment:  Every other day  ? Drug use: No  ? Sexual activity: Not on file  ?Other Topics Concern  ? Not on file  ?Social History Narrative  ? Not on file  ? ?Social Determinants of Health  ? ?Financial Resource Strain: Not on file  ?Food Insecurity: Not on file  ?Transportation Needs: Not on file  ?Physical Activity: Not on file  ?Stress: Not on file  ?Social Connections: Not on file  ?  ? ?Family History: ?The patient's family history includes Heart Problems in her father; Heart attack in an other family member; Hypertension in her mother; Thyroid disease in her mother. There is no history of Breast cancer. ? ?ROS:   ?Please see the history of present illness.    ? All other systems reviewed and are negative. ? ?EKGs/Labs/Other Studies Reviewed:   ? ?The following studies were reviewed today: ? ?Nuclear stress test 03/17/2018: ?There was 1.64mm of J point depression with horizontal to upsloping ST segement depression in the inferolateral leads at peak exercise which rapidly resolved in recovery. ?Nuclear stress EF: 65%. ?Blood pressure demonstrated a normal response to exercise. ?The study is normal. ?This is a low risk study. ?The left ventricular ejection fraction is  normal (55-65%). ?  ?ECHO 2019 ? ?- Left ventricle: The cavity size was normal. There was mild  ?  concentric hypertrophy. Systolic function was normal. The  ?  estimated ejection fraction was in the range of 60% to 65%. Wall  ?  motion was normal; there were no regional wall motion  ?  abnormalities. Doppler parameters are consistent with abnormal  ?  left ventricular relaxation (grade 1 diastolic dysfunction).  ?  There was no evidence of elevated ventricular filling pressure by  ?  Doppler parameters.  ?- Aortic root: The aortic root was normal in size.  ?- Mitral valve: There was mild regurgitation.  ?- Right ventricle: The cavity size was normal. Wall thickness was  ?  normal. Systolic function was normal.  ?- Pulmonic valve: There was no regurgitation.  ?-  Pulmonary arteries: Systolic pressure was at the upper limits of  ?  normal. PA peak pressure: 31 mm Hg (S).  ?- Inferior vena cava: The vessel was normal in size.  ?- Pericardium, extracardiac: There was no pericardial effusion.  ?  ? ?EKG:  EKG is  ordered today.  The ekg ordered today demonstrates sinus rhythm 87 nonspecific ST-T wave changes ? ?Recent Labs: ?No results found for requested labs within last 8760 hours.  ?Recent Lipid Panel ?No results found for: CHOL, TRIG, HDL, CHOLHDL, VLDL, LDLCALC, LDLDIRECT ? ? ?Risk Assessment/Calculations:   ? ? ?    ? ?   ? ?Physical Exam:   ? ?VS:  BP 110/68   Pulse 87   Ht 5\' 6"  (1.676 m)   Wt 148 lb (67.1 kg)   LMP  (LMP Unknown)   SpO2 96%   BMI 23.89 kg/m?    ? ?Wt Readings from Last 3 Encounters:  ?04/07/21 148 lb (67.1 kg)  ?12/30/19 148 lb (67.1 kg)  ?03/17/18 150 lb (68 kg)  ?  ? ?GEN:  Well nourished, well developed in no acute distress ?HEENT: Normal ?NECK: No JVD; No carotid bruits ?LYMPHATICS: No lymphadenopathy ?CARDIAC: RRR, very soft systolic murmur, no rubs, gallops ?RESPIRATORY:  Clear to auscultation without rales, wheezing or rhonchi  ?ABDOMEN: Soft, non-tender, non-distended ?MUSCULOSKELETAL:  No edema; No deformity  ?SKIN: Warm and dry ?NEUROLOGIC:  Alert and oriented x 3 ?PSYCHIATRIC:  Normal affect  ? ?ASSESSMENT:   ? ?1. Heart palpitations   ?2. Ventricular septal defect (VSD), perimembranous   ?3. Nonrheumatic mitral valve regurgitation   ?4. Pure hypercholesterolemia   ? ?PLAN:   ? ?In order of problems listed above: ? ?Ventricular septal defect (VSD), perimembranous ?Very small perimembranous VSD.  Last echo in 2019 overall showed normal function.  Excellent.  Next year, we will check another echocardiogram ? ?Mitral regurgitation ?Mild mitral regurgitation noted. ? ?Pure hypercholesterolemia ?Fluvastatin XL 80 mg a day.  LDL is being monitored, last in November 2022 was 109. ?  ? ? ? ?Medication Adjustments/Labs and Tests  Ordered: ?Current medicines are reviewed at length with the patient today.  Concerns regarding medicines are outlined above.  ?Orders Placed This Encounter  ?Procedures  ? EKG 12-Lead  ? ?No orders of the defined types were placed in this encounter. ? ? ?Patient Instructions  ?Medication Instructions:  ?The current medical regimen is effective;  continue present plan and medications. ? ?*If you need a refill on your cardiac medications before your next appointment, please call your pharmacy* ? ?Follow-Up: ?At [facility], you and your health needs are our priority.  As part of our  continuing mission to provide you with exceptional heart care, we have created designated Provider Care Teams.  These Care Teams include your primary Cardiologist (physician) and Advanced Practice Providers (APPs -  Physician Assistants and Nurse Practitioners) who all work together to provide you with the care you need, when you need it. ? ?We recommend signing up for the patient portal called "MyChart".  Sign up information is provided on this After Visit Summary.  MyChart is used to connect with patients for Virtual Visits (Telemedicine).  Patients are able to view lab/test results, encounter notes, upcoming appointments, etc.  Non-urgent messages can be sent to your provider as well.   ?To learn more about what you can do with MyChart, go to [email or web address].   ? ?Your next appointment:   ?1 year(s) ? ?The format for your next appointment:   ?In Person ? ?Provider:   ?Candee Furbish, MD   ? ?Thank you for choosing [facility]!! ? ? ?  ? ?Signed, ?Candee Furbish, MD  ?04/07/2021 4:23 PM    ?[place] ?

## 2021-04-07 NOTE — Patient Instructions (Signed)
Medication Instructions:  The current medical regimen is effective;  continue present plan and medications.  *If you need a refill on your cardiac medications before your next appointment, please call your pharmacy*  Follow-Up: At [facility], you and your health needs are our priority.  As part of our continuing mission to provide you with exceptional heart care, we have created designated Provider Care Teams.  These Care Teams include your primary Cardiologist (physician) and Advanced Practice Providers (APPs -  Physician Assistants and Nurse Practitioners) who all work together to provide you with the care you need, when you need it.  We recommend signing up for the patient portal called "MyChart".  Sign up information is provided on this After Visit Summary.  MyChart is used to connect with patients for Virtual Visits (Telemedicine).  Patients are able to view lab/test results, encounter notes, upcoming appointments, etc.  Non-urgent messages can be sent to your provider as well.   To learn more about what you can do with MyChart, go to [email or web address].    Your next appointment:   1 year(s)  The format for your next appointment:   In Person  Provider:   Mark Skains, MD   Thank you for choosing [facility]!!    

## 2021-04-07 NOTE — Assessment & Plan Note (Signed)
Mild mitral regurgitation noted. ?

## 2021-04-07 NOTE — Assessment & Plan Note (Signed)
Fluvastatin XL 80 mg a day.  LDL is being monitored, last in November 2022 was 109. ?

## 2021-05-02 ENCOUNTER — Other Ambulatory Visit: Payer: Self-pay | Admitting: *Deleted

## 2021-05-02 MED ORDER — METOPROLOL SUCCINATE ER 25 MG PO TB24: 25.0000 mg | ORAL_TABLET | Freq: Every day | ORAL | 3 refills | Status: DC

## 2021-11-29 ENCOUNTER — Other Ambulatory Visit: Payer: Self-pay | Admitting: Family Medicine

## 2021-11-29 DIAGNOSIS — Z1239 Encounter for other screening for malignant neoplasm of breast: Secondary | ICD-10-CM

## 2021-12-04 ENCOUNTER — Telehealth: Payer: Self-pay | Admitting: Cardiology

## 2021-12-04 DIAGNOSIS — R002 Palpitations: Secondary | ICD-10-CM

## 2021-12-04 NOTE — Telephone Encounter (Signed)
  Sandra Monroe - Dr. Rondel Baton office calling, she said pt was seen here today for meds check and pt is reporting history of numerous palpitation happening continuously for the last 2 days. Dr. Hyacinth Meeker recommend pt needs event monitor to rule out afib. She said she will fax over OV notes and lab results. She said, to give he a call back for recommendations

## 2021-12-04 NOTE — Telephone Encounter (Signed)
Attempted to contact pt to see if EKG was completed today.  No answer and no voicemail.  No documentation in chart of any known At Fib only palps. Will forward to Dr Anne Fu for review and any orders.

## 2021-12-05 NOTE — Telephone Encounter (Signed)
Pt returning a call from Orlie Dakin, RN. Informed pt that she is out today, will forward to her so she can c/b when she returns

## 2021-12-06 ENCOUNTER — Ambulatory Visit: Payer: Medicare Other | Attending: Cardiology

## 2021-12-06 DIAGNOSIS — R002 Palpitations: Secondary | ICD-10-CM

## 2021-12-06 NOTE — Telephone Encounter (Signed)
Attempted to contact pt at both home and mobile #.  No answer and no voicemail at either one.  Will place order for zio per Dr Anne Fu and send message to pt through MyChart.

## 2021-12-06 NOTE — Progress Notes (Unsigned)
Enrolled patient for a 14 day Zio XT monitor to be mailed to patiens home 

## 2021-12-06 NOTE — Telephone Encounter (Signed)
  Please order Zio Thanks Donato Schultz, MD

## 2021-12-09 DIAGNOSIS — R002 Palpitations: Secondary | ICD-10-CM

## 2022-01-07 IMAGING — DX DG HIP (WITH OR WITHOUT PELVIS) 2-3V*L*
2 series · 2 of 2 positions shown · non-contrast
Comparison: None.

CLINICAL DATA: Left hip pain 3-4 months.  No injury.

EXAM:
DG HIP (WITH OR WITHOUT PELVIS) 2-3V LEFT

[dg hip unilat w or w/o pelvis 2-3 views  (1 of 2)]
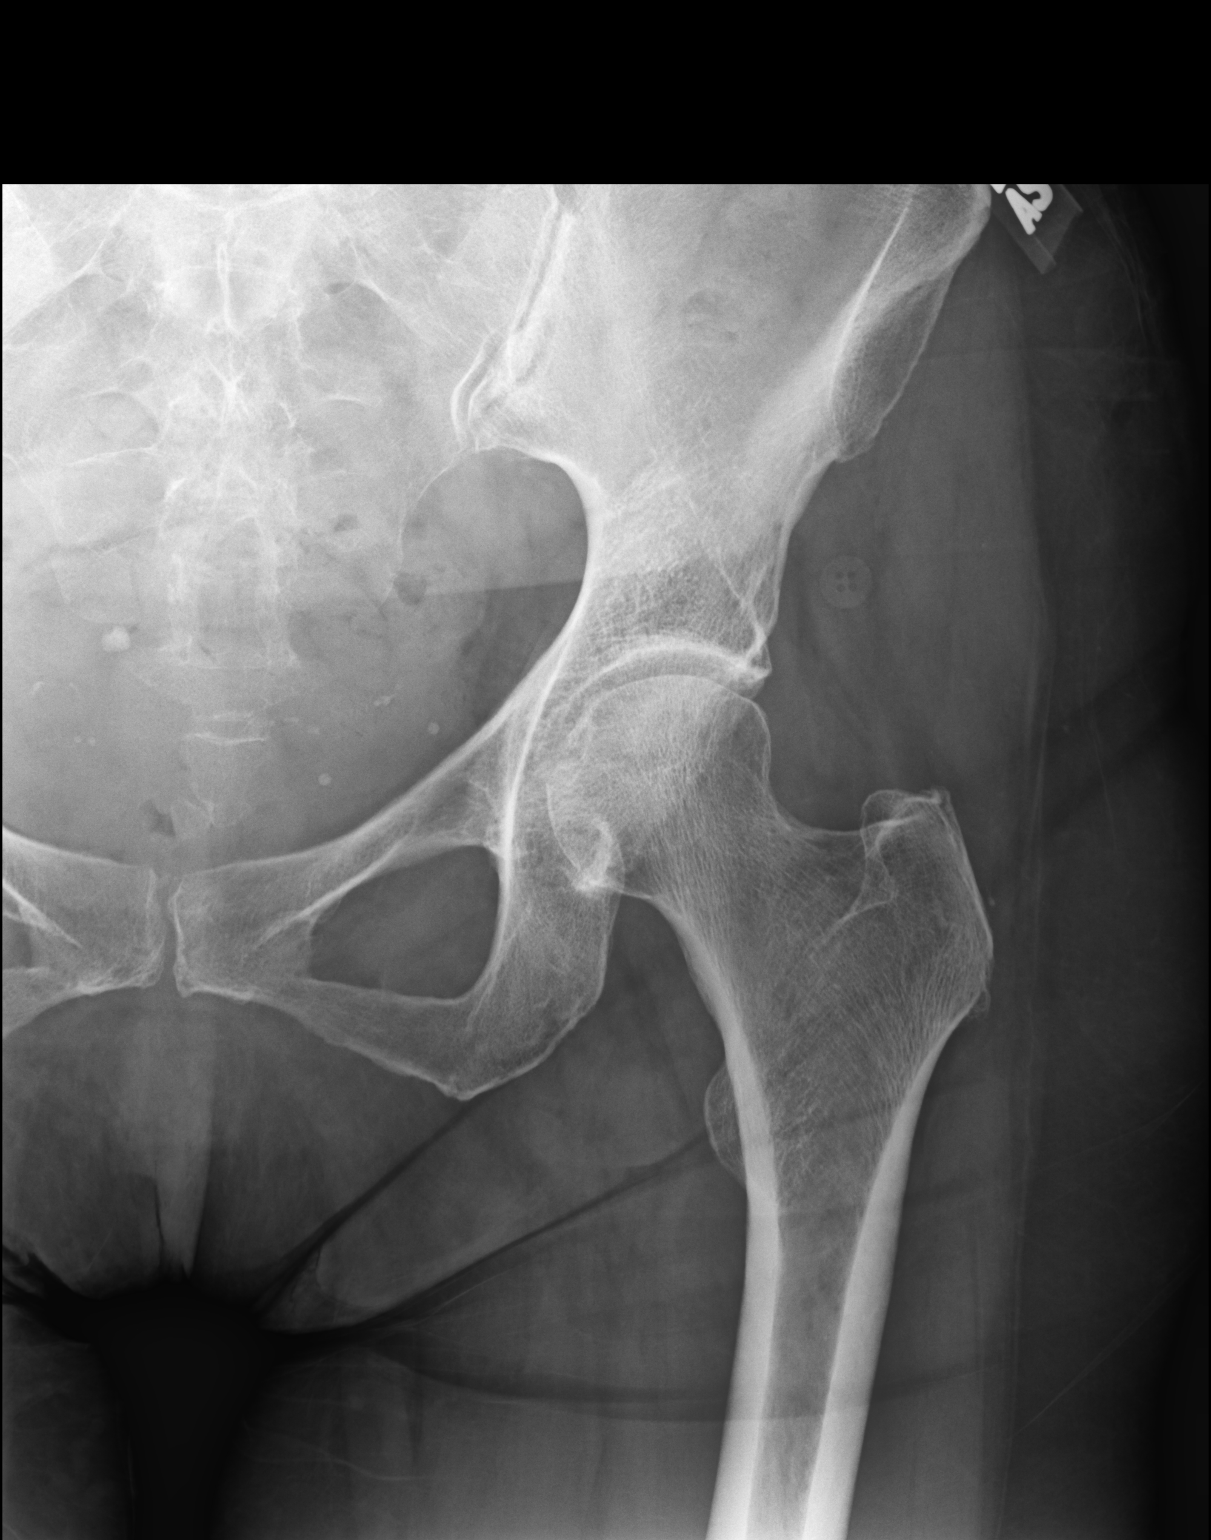

[dg hip unilat w or w/o pelvis 2-3 views  (2 of 2)]
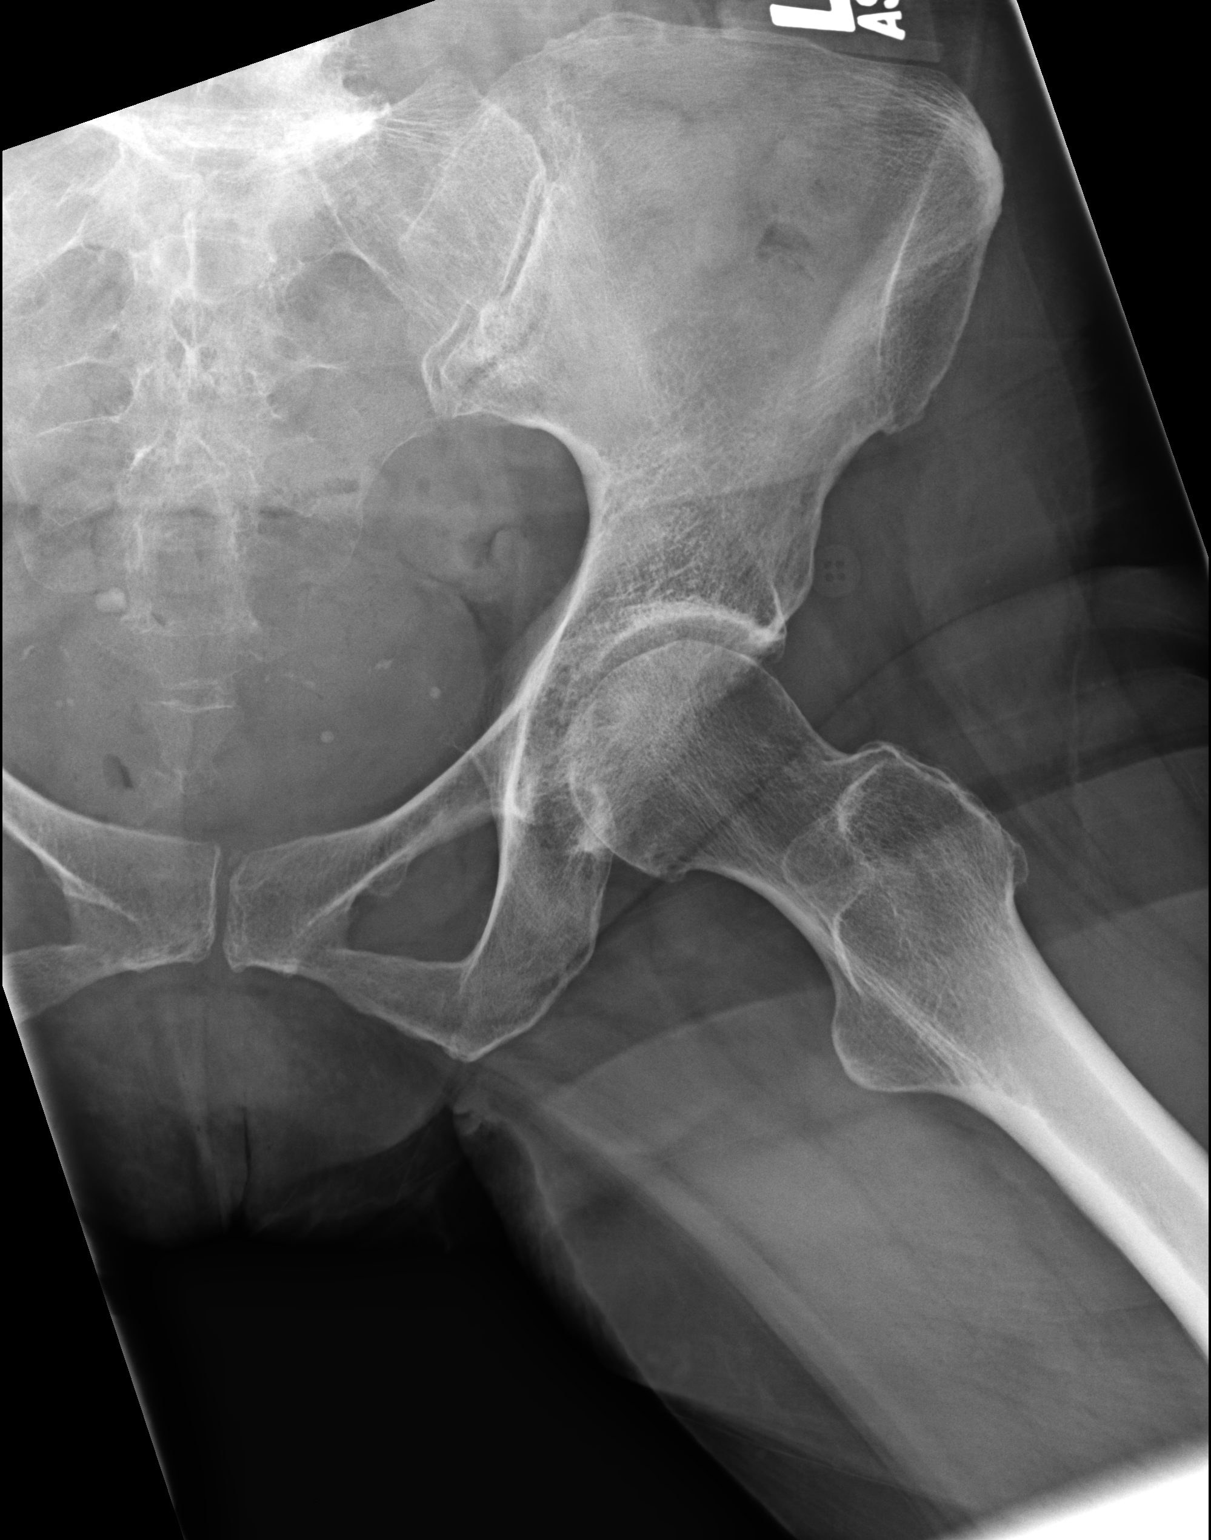

[2 of 2 positions shown; findings below may reference images not displayed]

FINDINGS: Mild degenerative changes of the left hip. No acute fracture or
dislocation. Mild degenerative change of the spine and left
sacroiliac joint. Several pelvic phleboliths.
IMPRESSION: No acute findings.

## 2022-01-31 ENCOUNTER — Ambulatory Visit
Admission: RE | Admit: 2022-01-31 | Discharge: 2022-01-31 | Disposition: A | Discharge status: Home / Self Care | Payer: Medicare Other | Source: Ambulatory Visit | Attending: Family Medicine | Admitting: Family Medicine

## 2022-01-31 DIAGNOSIS — Z1239 Encounter for other screening for malignant neoplasm of breast: Secondary | ICD-10-CM

## 2022-02-28 ENCOUNTER — Telehealth: Payer: Self-pay | Admitting: *Deleted

## 2022-02-28 NOTE — Telephone Encounter (Signed)
   Name: Sandra Monroe  DOB: 1946/01/14  MRN: [id number]  Primary Cardiologist: Candee Furbish, MD   Preoperative team, please contact this patient and set up a phone call appointment for further preoperative risk assessment. Please obtain consent and complete medication review. Thank you for your help.  I confirm that guidance regarding antiplatelet and oral anticoagulation therapy has been completed and, if necessary, noted below (none requested).    Lenna Sciara, NP 02/28/2022, 12:25 PM [place]

## 2022-02-28 NOTE — Telephone Encounter (Signed)
   Pre-operative Risk Assessment    Patient Name: Sandra Monroe  DOB: [birth year] MRN: [id number]      Request for Surgical Clearance    Procedure:  COLONOSCOPY  Date of Surgery:  Clearance 03/27/22                                 Surgeon:  DR. Danton Clap Surgeon's Group or Practice Name:  [facility] Phone number:  [phone] Fax number:  [phone]   Type of Clearance Requested:   - Medical    Type of Anesthesia:  Not Indicated   Additional requests/questions:    Astrid Divine   02/28/2022, 6:38 AM

## 2022-03-01 ENCOUNTER — Telehealth: Payer: Self-pay | Admitting: *Deleted

## 2022-03-01 NOTE — Telephone Encounter (Signed)
Pt has been scheduled for a tele visit, 03/02/22 2:00.  Consent on file / medications reconciled.    Patient Consent for Virtual Visit        Sandra Monroe has provided verbal consent on 03/01/2022 for a virtual visit (video or telephone).   CONSENT FOR VIRTUAL VISIT FOR:  Sandra Monroe  By participating in this virtual visit I agree to the following:  I hereby voluntarily request, consent and authorize [facility] and its employed or contracted physicians, physician assistants, nurse practitioners or other licensed health care professionals (the Practitioner), to provide me with telemedicine health care services (the "Services") as deemed necessary by the treating Practitioner. I acknowledge and consent to receive the Services by the Practitioner via telemedicine. I understand that the telemedicine visit will involve communicating with the Practitioner through live audiovisual communication technology and the disclosure of certain medical information by electronic transmission. I acknowledge that I have been given the opportunity to request an in-person assessment or other available alternative prior to the telemedicine visit and am voluntarily participating in the telemedicine visit.  I understand that I have the right to withhold or withdraw my consent to the use of telemedicine in the course of my care at any time, without affecting my right to future care or treatment, and that the Practitioner or I may terminate the telemedicine visit at any time. I understand that I have the right to inspect all information obtained and/or recorded in the course of the telemedicine visit and may receive copies of available information for a reasonable fee.  I understand that some of the potential risks of receiving the Services via telemedicine include:  Delay or interruption in medical evaluation due to technological equipment failure or disruption; Information transmitted may not be sufficient (e.g.  poor resolution of images) to allow for appropriate medical decision making by the Practitioner; and/or  In rare instances, security protocols could fail, causing a breach of personal health information.  Furthermore, I acknowledge that it is my responsibility to provide information about my medical history, conditions and care that is complete and accurate to the best of my ability. I acknowledge that Practitioner's advice, recommendations, and/or decision may be based on factors not within their control, such as incomplete or inaccurate data provided by me or distortions of diagnostic images or specimens that may result from electronic transmissions. I understand that the practice of medicine is not an exact science and that Practitioner makes no warranties or guarantees regarding treatment outcomes. I acknowledge that a copy of this consent can be made available to me via my patient portal ([place]), or I can request a printed copy by calling the office of [facility].    I understand that my insurance will be billed for this visit.   I have read or had this consent read to me. I understand the contents of this consent, which adequately explains the benefits and risks of the Services being provided via telemedicine.  I have been provided ample opportunity to ask questions regarding this consent and the Services and have had my questions answered to my satisfaction. I give my informed consent for the services to be provided through the use of telemedicine in my medical care

## 2022-03-01 NOTE — Progress Notes (Signed)
Virtual Visit via Telephone Note   Because of Leaundra Monroe's co-morbid illnesses, she is at least at moderate risk for complications without adequate follow up.  This format is felt to be most appropriate for this patient at this time.  The patient did not have access to video technology/had technical difficulties with video requiring transitioning to audio format only (telephone).  All issues noted in this document were discussed and addressed.  No physical exam could be performed with this format.  Please refer to the patient's chart for her consent to telehealth for [facility].  Evaluation Performed:  Preoperative cardiovascular risk assessment _____________   Date:  03/01/2022   Patient ID:  Sandra Monroe, DOB 14-Mar-1945, MRN [id number] Patient Location:  Home Provider location:   Office  Primary Care Provider:  Kathyrn Lass, MD Primary Cardiologist:  Candee Furbish, MD  Chief Complaint / Patient Profile   77 y.o. y/o female with a h/o small perimembranous VSD, mild MR, palpitations, HLD, hypothyroidism who is pending colonoscopy and presents today for telephonic preoperative cardiovascular risk assessment.  History of Present Illness    Sandra Monroe is a 77 y.o. female who presents via audio/video conferencing for a telehealth visit today.  Pt was last seen in cardiology clinic on 04/07/2021 by Dr. Marlou Porch.  At that time [name] was doing well with no cardiac complaints but was having joint pain in her ankles.  She had some palpitations and wore an event monitor and December that showed no pauses or sustained arrhythmias.  The patient is now pending procedure as outlined above. Since her last visit, she has been doing well with no new cardiac complaints.  She reports that her palpitations are very minimal and are not bothersome.  She denies chest pain, shortness of breath, lower extremity edema, fatigue, palpitations, melena, hematuria, hemoptysis, diaphoresis, weakness,  presyncope, syncope, orthopnea, and PND.   None requested Past Medical History    Past Medical History:  Diagnosis Date   Dermatitis    Diastolic dysfunction    Hyperlipidemia    Hypothyroidism    Menopause    Mitral regurgitation 11/24/2012   Mild    Ventricular septal defect (VSD), perimembranous    , Small   Past Surgical History:  Procedure Laterality Date   CESAREAN SECTION     COMBINED HYSTEROSCOPY DIAGNOSTIC / D&C      Allergies  No Known Allergies  Home Medications    Prior to Admission medications   Medication Sig Start Date End Date Taking? Authorizing Provider  metoprolol succinate (TOPROL-XL) 25 MG 24 hr tablet Take 1 tablet (25 mg total) by mouth daily. 05/02/21   Jerline Pain, MD  Multiple Vitamin (MULTIVITAMIN) tablet Take 1 tablet by mouth daily.    [provider]  rosuvastatin (CRESTOR) 10 MG tablet Take 10 mg by mouth daily.    [provider]  SYNTHROID 25 MCG tablet Take 25 mcg by mouth daily. 11/27/19   [provider]    Physical Exam    Vital Signs:  [name] does not have vital signs available for review today.  Given telephonic nature of communication, physical exam is limited. AAOx3. NAD. Normal affect.  Speech and respirations are unlabored.  Accessory Clinical Findings    None  Assessment & Plan    1.  Preoperative Cardiovascular Risk Assessment:  The patient affirms she has been doing well without any new cardiac symptoms. They are able to achieve 5 METS without cardiac limitations. Therefore, based  on ACC/AHA guidelines, the patient would be at acceptable risk for the planned procedure without further cardiovascular testing. The patient was advised that if she develops new symptoms prior to surgery to contact our office to arrange for a follow-up visit, and she verbalized understanding.   Ms. Niwa perioperative risk of a major cardiac event is 0.4% according to the Revised Cardiac Risk Index  (RCRI).  Therefore, she is at low risk for perioperative complications.   Her functional capacity is good at 5.07 METs according to the Duke Activity Status Index (DASI). Recommendations: According to ACC/AHA guidelines, no further cardiovascular testing needed.  The patient may proceed to surgery at acceptable risk.   Antiplatelet and/or Anticoagulation Recommendations: Not applicable    The patient was advised that if she develops new symptoms prior to surgery to contact our office to arrange for a follow-up visit, and she verbalized understanding.   Time:   Today, I have spent 6 minutes with the patient with telehealth technology discussing medical history, symptoms, and management plan.     Mable Fill, Marissa Nestle, NP  03/01/2022, 12:04 PM

## 2022-03-01 NOTE — Telephone Encounter (Signed)
Pt has been scheduled for a tele visit, 03/02/22 2:00.  Consent on file / medications reconciled.

## 2022-03-02 ENCOUNTER — Ambulatory Visit: Payer: Medicare Other | Attending: Cardiology

## 2022-03-02 DIAGNOSIS — Z01810 Encounter for preprocedural cardiovascular examination: Secondary | ICD-10-CM

## 2022-04-18 ENCOUNTER — Other Ambulatory Visit: Payer: Self-pay | Admitting: Cardiology

## 2023-05-14 ENCOUNTER — Other Ambulatory Visit: Payer: Self-pay | Admitting: Cardiology

## 2023-06-25 ENCOUNTER — Telehealth: Payer: Self-pay | Admitting: Cardiology

## 2023-06-25 MED ORDER — METOPROLOL SUCCINATE ER 25 MG PO TB24: 25.0000 mg | ORAL_TABLET | Freq: Every day | ORAL | 0 refills | Status: DC

## 2023-06-25 NOTE — Telephone Encounter (Signed)
 Pt's medication was sent to pt's pharmacy as requested. Confirmation received.

## 2023-06-25 NOTE — Telephone Encounter (Signed)
*  STAT* If patient is at the pharmacy, call can be transferred to refill team.   1. Which medications need to be refilled? (please list name of each medication and dose if known) metoprolol  succinate (TOPROL -XL) 25 MG 24 hr tablet    2. Which pharmacy/location (including street and city if local pharmacy) is medication to be sent to? [facility] - [place], [place] - [address] [facility] AT Portal to Registered [employer] Sites [phone]   3. Do they need a 30 day or 90 day supply? 90

## 2023-06-27 MED ORDER — METOPROLOL SUCCINATE ER 25 MG PO TB24: 25.0000 mg | ORAL_TABLET | Freq: Every day | ORAL | 0 refills | Status: DC

## 2023-06-27 NOTE — Addendum Note (Signed)
 Addended by: Gayleen Kawasaki D on: 06/27/2023 12:08 PM   Modules accepted: Orders

## 2023-09-22 ENCOUNTER — Other Ambulatory Visit: Payer: Self-pay | Admitting: Cardiology

## 2023-10-07 ENCOUNTER — Encounter: Payer: Self-pay | Admitting: Cardiology

## 2023-10-07 ENCOUNTER — Ambulatory Visit: Attending: Cardiology | Admitting: Cardiology

## 2023-10-07 VITALS — BP 114/69 | HR 84 | Ht 66.0 in | Wt 135.0 lb

## 2023-10-07 DIAGNOSIS — R002 Palpitations: Secondary | ICD-10-CM | POA: Diagnosis present

## 2023-10-07 DIAGNOSIS — I340 Nonrheumatic mitral (valve) insufficiency: Secondary | ICD-10-CM | POA: Insufficient documentation

## 2023-10-07 DIAGNOSIS — Q210 Ventricular septal defect: Secondary | ICD-10-CM | POA: Insufficient documentation

## 2023-10-07 NOTE — Patient Instructions (Signed)
 Medication Instructions:  The current medical regimen is effective;  continue present plan and medications.  *If you need a refill on your cardiac medications before your next appointment, please call your pharmacy*  Testing/Procedures: Your physician has requested that you have an echocardiogram. Echocardiography is a painless test that uses sound waves to create images of your heart. It provides your doctor with information about the size and shape of your heart and how well your heart's chambers and valves are working. This procedure takes approximately one hour. There are no restrictions for this procedure. Please do NOT wear cologne, perfume, aftershave, or lotions (deodorant is allowed). Please arrive 15 minutes prior to your appointment time.  Please note: We ask at that you not bring children with you during ultrasound (echo/ vascular) testing. Due to room size and safety concerns, children are not allowed in the ultrasound rooms during exams. Our front office staff cannot provide observation of children in our lobby area while testing is being conducted. An adult accompanying a patient to their appointment will only be allowed in the ultrasound room at the discretion of the ultrasound technician under special circumstances. We apologize for any inconvenience.  Follow-Up: At [facility], you and your health needs are our priority.  As part of our continuing mission to provide you with exceptional heart care, our providers are all part of one team.  This team includes your primary Cardiologist (physician) and Advanced Practice Providers or APPs (Physician Assistants and Nurse Practitioners) who all work together to provide you with the care you need, when you need it.  Your next appointment:   1 year(s)  Provider:   Dorothye Gathers, MD    We recommend signing up for the patient portal called "MyChart".  Sign up information is provided on this After Visit Summary.  MyChart is used to  connect with patients for Virtual Visits (Telemedicine).  Patients are able to view lab/test results, encounter notes, upcoming appointments, etc.  Non-urgent messages can be sent to your provider as well.   To learn more about what you can do with MyChart, go to [email or web address].

## 2023-10-07 NOTE — Progress Notes (Signed)
 Cardiology Office Note:  .   Date:  10/07/2023  ID:  Sandra Monroe, DOB May 16, 1945, MRN [id number] PCP: Cleotilde Planas, MD  [facility] Providers Cardiologist:  Oneil Parchment, MD    History of Present Illness: .   Sandra Monroe is a 78 y.o. female Discussed the use of AI scribe software   History of Present Illness Sandra Monroe is a 78 year old female with a small paramembranous VSD and mild mitral regurgitation who presents for follow-up.  She experiences palpitations and swelling in her ankles, more pronounced on the right side. The swelling is absent upon waking but develops after walking for a few minutes. Her varicose veins bulge more on the right side, but there is no associated pain. Walking improves her symptoms, but she avoids going outside when the weather is unsuitable.  She underwent a nuclear stress test in 2020, which was a low-risk study, and an echocardiogram in 2019, which was reassuring. In 2023, a heart monitor showed rare PACs and PVCs with brief Wenckebach, no pauses, no atrial fibrillation, and sinus rhythm with first-degree AV block.  Her current medications include fluvastatin XL 80 mg daily, rosuvastatin 10 mg daily, and Synthroid 25 mcg daily.  No chest pain or shortness of breath. She reports occasional redness in her joints but no pain.    Studies Reviewed: SABRA   EKG Interpretation Date/Time:  Monday October 07 2023 10:27:02 EDT Ventricular Rate:  84 PR Interval:  152 QRS Duration:  78 QT Interval:  360 QTC Calculation: 425 R Axis:   84  Text Interpretation: Normal sinus rhythm Nonspecific ST abnormality No previous ECGs available Confirmed by Parchment Oneil ([id number]) on 10/07/2023 10:32:11 AM    Results LABS LDL: 65 (12/05/2022) Hb: 13.1 ALT: 18  DIAGNOSTIC Nuclear stress test: Overall low risk (2020) Echocardiogram: Reassuring (2019) Heart monitor: Rare premature atrial contractions (PACs) and premature ventricular contractions (PVCs), brief  Wenckebach, no pauses, no atrial fibrillation, sinus rhythm, first-degree atrioventricular (AV) block (2023) Electrocardiogram (EKG): Normal Risk Assessment/Calculations:            Physical Exam:   VS:  BP 114/69 (BP Location: Left Arm, Patient Position: Sitting, Cuff Size: Normal)   Pulse 84   Ht 5' 6 (1.676 m)   Wt 135 lb (61.2 kg)   LMP  (LMP Unknown)   SpO2 97%   BMI 21.79 kg/m    Wt Readings from Last 3 Encounters:  10/07/23 135 lb (61.2 kg)  04/07/21 148 lb (67.1 kg)  12/30/19 148 lb (67.1 kg)    GEN: Well nourished, well developed in no acute distress NECK: No JVD; No carotid bruits CARDIAC: RRR, no murmurs, no rubs, no gallops RESPIRATORY:  Clear to auscultation without rales, wheezing or rhonchi  ABDOMEN: Soft, non-tender, non-distended EXTREMITIES:  No edema; No deformity   ASSESSMENT AND PLAN: .    Assessment and Plan Assessment & Plan Ventricular septal defect and mild mitral regurgitation Small paramembranous VSD and mild mitral regurgitation. Previous echocardiogram in 2019 was reassuring. Current EKG is normal. - Order echocardiogram to reassess cardiac status  Palpitations Palpitations with previous heart monitor in 2023 showing rare PACs and PVCs, brief Wenckebach, no pauses, no atrial fibrillation, sinus rhythm, and first-degree AV block. Currently asymptomatic with no chest pain or shortness of breath.  Hyperlipidemia Hyperlipidemia well-controlled with rosuvastatin. LDL cholesterol is excellent at 65 mg/dL as of November 7975. - Continue rosuvastatin 10 mg daily  Varicose veins with lower extremity swelling Varicose veins with more swelling  in the right leg than the left. Swelling resolves overnight and increases with activity. No pain associated with varicose veins. Advised on conservative management options. - Advise use of support stockings - Recommend leg elevation when possible - Consider referral to vascular surgery if symptoms become  troublesome  Osteoarthritis Osteoarthritis with occasional redness in joints. No significant pain reported.  Hypothyroidism Hypothyroidism managed with Synthroid. Current dosage is 25 micrograms daily. - Continue Synthroid 25 micrograms daily         Dispo: 22yr  Signed, Oneil Parchment, MD

## 2023-10-12 ENCOUNTER — Other Ambulatory Visit: Payer: Self-pay | Admitting: Cardiology

## 2023-11-14 ENCOUNTER — Ambulatory Visit (HOSPITAL_COMMUNITY)
Admission: RE | Admit: 2023-11-14 | Discharge: 2023-11-14 | Disposition: A | Discharge status: Home / Self Care | Source: Ambulatory Visit | Attending: Internal Medicine | Admitting: Internal Medicine

## 2023-11-14 DIAGNOSIS — Q210 Ventricular septal defect: Secondary | ICD-10-CM | POA: Insufficient documentation

## 2023-11-14 DIAGNOSIS — I340 Nonrheumatic mitral (valve) insufficiency: Secondary | ICD-10-CM | POA: Diagnosis not present

## 2023-11-14 LAB — ECHOCARDIOGRAM COMPLETE
Area-P 1/2: 3.46 cm2
S' Lateral: 2.4 cm

## 2023-11-15 ENCOUNTER — Ambulatory Visit: Payer: Self-pay | Admitting: Cardiology

## 2023-12-06 ENCOUNTER — Other Ambulatory Visit (HOSPITAL_BASED_OUTPATIENT_CLINIC_OR_DEPARTMENT_OTHER): Payer: Self-pay | Admitting: Family Medicine

## 2023-12-06 DIAGNOSIS — Z780 Asymptomatic menopausal state: Secondary | ICD-10-CM

## 2024-03-14 ENCOUNTER — Other Ambulatory Visit (HOSPITAL_BASED_OUTPATIENT_CLINIC_OR_DEPARTMENT_OTHER)
